# Patient Record
Sex: Male | Born: 1955
Health system: Southern US, Community
[De-identification: ages and names within clinical notes are randomized; demographics above are authoritative.]

## PROBLEM LIST (undated history)

## (undated) DIAGNOSIS — H269 Unspecified cataract: Secondary | ICD-10-CM

## (undated) DIAGNOSIS — I10 Essential (primary) hypertension: Secondary | ICD-10-CM

## (undated) DIAGNOSIS — C180 Malignant neoplasm of cecum: Secondary | ICD-10-CM

## (undated) DIAGNOSIS — K219 Gastro-esophageal reflux disease without esophagitis: Secondary | ICD-10-CM

## (undated) DIAGNOSIS — C801 Malignant (primary) neoplasm, unspecified: Secondary | ICD-10-CM

## (undated) HISTORY — DX: Gastro-esophageal reflux disease without esophagitis: K21.9

## (undated) HISTORY — PX: VARICOSE VEIN SURGERY: SHX832

## (undated) HISTORY — PX: INGUINAL HERNIA REPAIR: SUR1180

## (undated) HISTORY — PX: MENISCUS REPAIR: SHX5179

## (undated) HISTORY — DX: Essential (primary) hypertension: I10

---
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
## 2013-05-09 DIAGNOSIS — H269 Unspecified cataract: Secondary | ICD-10-CM | POA: Diagnosis present

## 2013-05-09 HISTORY — DX: Unspecified cataract: H26.9

## 2015-06-22 DIAGNOSIS — H401334 Pigmentary glaucoma, bilateral, indeterminate stage: Secondary | ICD-10-CM | POA: Insufficient documentation

## 2015-06-22 DIAGNOSIS — H25012 Cortical age-related cataract, left eye: Secondary | ICD-10-CM | POA: Insufficient documentation

## 2015-06-22 DIAGNOSIS — N529 Male erectile dysfunction, unspecified: Secondary | ICD-10-CM | POA: Insufficient documentation

## 2015-06-22 DIAGNOSIS — Z961 Presence of intraocular lens: Secondary | ICD-10-CM | POA: Insufficient documentation

## 2015-06-22 DIAGNOSIS — H04123 Dry eye syndrome of bilateral lacrimal glands: Secondary | ICD-10-CM | POA: Insufficient documentation

## 2015-06-22 DIAGNOSIS — I10 Essential (primary) hypertension: Secondary | ICD-10-CM | POA: Diagnosis present

## 2015-06-22 DIAGNOSIS — R7301 Impaired fasting glucose: Secondary | ICD-10-CM | POA: Insufficient documentation

## 2016-12-21 ENCOUNTER — Telehealth (HOSPITAL_COMMUNITY): Payer: Self-pay | Admitting: *Deleted

## 2016-12-21 DIAGNOSIS — Z8249 Family history of ischemic heart disease and other diseases of the circulatory system: Secondary | ICD-10-CM

## 2016-12-21 NOTE — Telephone Encounter (Signed)
Per Dr Haroldine Laws pt needs ca score CT scan, order placed will schedule

## 2016-12-30 ENCOUNTER — Ambulatory Visit (INDEPENDENT_AMBULATORY_CARE_PROVIDER_SITE_OTHER)
Admission: RE | Admit: 2016-12-30 | Discharge: 2016-12-30 | Disposition: A | Discharge status: Home / Self Care | Payer: Self-pay | Source: Ambulatory Visit | Attending: Internal Medicine | Admitting: Internal Medicine

## 2016-12-30 DIAGNOSIS — Z8249 Family history of ischemic heart disease and other diseases of the circulatory system: Secondary | ICD-10-CM

## 2018-05-09 HISTORY — PX: COLONOSCOPY: SHX174

## 2018-05-09 HISTORY — PX: ENDOSCOPIC VEIN LASER TREATMENT: SHX1508

## 2018-05-09 HISTORY — PX: POLYPECTOMY: SHX149

## 2018-12-26 ENCOUNTER — Other Ambulatory Visit: Payer: Self-pay

## 2018-12-26 ENCOUNTER — Ambulatory Visit (HOSPITAL_COMMUNITY)
Admission: RE | Admit: 2018-12-26 | Discharge: 2018-12-26 | Disposition: A | Discharge status: Home / Self Care | Payer: Self-pay | Source: Ambulatory Visit | Attending: Internal Medicine | Admitting: Internal Medicine

## 2018-12-26 DIAGNOSIS — I2584 Coronary atherosclerosis due to calcified coronary lesion: Secondary | ICD-10-CM

## 2018-12-26 DIAGNOSIS — I10 Essential (primary) hypertension: Secondary | ICD-10-CM

## 2018-12-26 DIAGNOSIS — I2510 Atherosclerotic heart disease of native coronary artery without angina pectoris: Secondary | ICD-10-CM

## 2018-12-26 DIAGNOSIS — R0683 Snoring: Secondary | ICD-10-CM

## 2018-12-26 NOTE — Progress Notes (Signed)
     Heart Failure TeleHealth Note  Due to national recommendations of social distancing due to [place] 19, Audio/video telehealth visit is felt to be most appropriate for this patient at this time.  See MyChart message from today for patient consent regarding telehealth for [facility].  Date:  12/26/2018   ID:  Isaiah Gray, DOB 1955/12/22, MRN [id number]  Location: Home  Provider location: [facility] Type of Visit: Established patient  PCP:  Derrill Center., MD  Cardiologist:  No primary care provider on file. Primary HF: [place]  Chief Complaint: HTN   History of Present Illness:  Isaiah Gray is as a 63 y/o male with h/o HTN, elevated fasting glucose and erectile dysfunction who presents via audio/video conferencing for a telehealth visit today regarding his HTN.    Denies any known h/o CV disease. Has calcium scoring CT in 8/18 with score 57 (59%)   Remains active with no CP or SOB. Has been on losartan 50 mg daily for HTN and recent his SBP shot up to 180s for a few days for unknown reasons. He increased his losartan to 100mg  daily and now SBP 115-135 range. Denies edema, orthopnea or PND. Says he has been told he snores heavily and has some fatigue. He drinks a moderate amount of alcohol at times but hasn't had a problem with it.   Isaiah Gray denies symptoms worrisome for COVID 19.   PMHX: 1. HTN 2. Erectile dysfunction  Meds: Losartan 100 daily ECASA 81 mg daily Sildenafil prn  Allergies:   Patient has no allergy information on record.   Social History:  Married. Non-smoker. Owns an Water quality scientist. Moderate ETOH  Family History:  No FHx of premature CAD or SCD.  ROS:  Please see the history of present illness.   All other systems are personally reviewed and negative.   Exam:  (Video/Tele Health Call; Exam is subjective and or/visual.) General:  Speaks in full sentences. No resp difficulty. Lungs: Normal respiratory effort with  conversation.  Abdomen: Non-distended per patient report Extremities: Pt denies edema. Neuro: Alert & oriented x 3.   Recent Labs: No results found for requested labs within last 8760 hours.  Personally reviewed   Wt Readings from Last 3 Encounters:  No data found for Wt      ASSESSMENT AND PLAN:  1.  HTN - BP now well controlled on increased losartan. Will continue  2. Coronary calcium - score was 57 (59%) in 2018 - on ecasa 81 - continue RF modification - last LDL was 106 in 11/19. Would like to see LDL < 100. He is resistant to statins. Consider red yeast rice  3. Heavy snoring - likely has OSA - will get home sleep study.   COVID screen The patient does not have any symptoms that suggest any further testing/ screening at this time.  Social distancing reinforced today.  Recommended follow-up:  As above  Relevant cardiac medications were reviewed at length with the patient today.   The patient does not have concerns regarding their medications at this time.   The following changes were made today:  As above  Today, I have spent 15 minutes with the patient with telehealth technology discussing the above issues .    Signed, Glori Bickers, MD  12/26/2018 2:40 PM  [facility] [place] [address] [facility] and [place] [address] [phone] (office) [phone] (fax)

## 2019-01-10 ENCOUNTER — Encounter: Payer: Self-pay | Admitting: Gastroenterology

## 2019-01-10 ENCOUNTER — Ambulatory Visit (INDEPENDENT_AMBULATORY_CARE_PROVIDER_SITE_OTHER): Payer: BC Managed Care – PPO | Admitting: Gastroenterology

## 2019-01-10 VITALS — BP 130/70 | HR 76 | Temp 98.3°F | Ht 70.0 in | Wt 181.1 lb

## 2019-01-10 DIAGNOSIS — Z86010 Personal history of colonic polyps: Secondary | ICD-10-CM

## 2019-01-10 DIAGNOSIS — K219 Gastro-esophageal reflux disease without esophagitis: Secondary | ICD-10-CM

## 2019-01-10 DIAGNOSIS — R0789 Other chest pain: Secondary | ICD-10-CM | POA: Diagnosis not present

## 2019-01-10 MED ORDER — SUCRALFATE 1 GM/10ML PO SUSP: 1.0000 g | Freq: Four times a day (QID) | ORAL | 1 refills | Status: DC | PRN

## 2019-01-10 MED ORDER — SUPREP BOWEL PREP KIT 17.5-3.13-1.6 GM/177ML PO SOLN: ORAL | 0 refills | Status: DC

## 2019-01-10 MED ORDER — OMEPRAZOLE MAGNESIUM 20 MG PO TBEC: 20.0000 mg | DELAYED_RELEASE_TABLET | Freq: Two times a day (BID) | ORAL | 3 refills | Status: DC

## 2019-01-10 NOTE — Patient Instructions (Addendum)
Normal BMI (Body Mass Index- based on height and weight) is between 19 and 25. Your BMI today is Body mass index is 25.99 kg/m. Marland Kitchen Please consider follow up  regarding your BMI with your Primary Care Provider.  To help prevent the possible spread of infection to our patients, communities, and staff; we will be implementing the following measures:  As of now we are not allowing any visitors/family members to accompany you to any upcoming appointments with [facility]. If you have any concerns about this please contact our office to discuss prior to the appointment.   You have been scheduled for an endoscopy. Please follow written instructions given to you at your visit today. If you use inhalers (even only as needed), please bring them with you on the day of your procedure. Your physician has requested that you go to www.startemmi.com and enter the access code given to you at your visit today. This web site gives a general overview about your procedure. However, you should still follow specific instructions given to you by our office regarding your preparation for the procedure.  We have sent the following medications to your pharmacy for you to pick up at your convenience: Omeprazole 20mg : Take twice a day Carafate suspension: Take 57ml every 6 hours as needed Suprep   Thank you for entrusting me with your care and for choosing [employer], Dr. Hutchins Cellar

## 2019-01-10 NOTE — Progress Notes (Signed)
HPI :  63 y/o male with a history of GERD and HTN, referred by Dr. Gwenyth Allegra for GERD / atypical chest pain.   He reports at baseline he has had some intermittent reflux that bothered him over years.  He has been using either over-the-counter Zantac or Prilosec OTC for years periodically, does not use daily.  About 2 weeks ago he developed new symptoms in which he developed pain in his mid chest radiating to his right shoulder blade after eating.  Sometimes he feels it more so with liquids than with solids.  Usually within a few minutes after swallowing he can feel an odd sensation in his mid to right back, and also feels it in his mid to lower chest at times - he interestingly feels this more prominently in his back than his chest  He denies any dysphagia or odynophagia, the active swallowing is fine.  If he is not eating anything or drinking anything he will not have any discomfort.  He denies any nausea or vomiting.  He denies any pain in his epigastric area or right upper quadrant.  He was initially given Protonix to take however states he felt perhaps worse on this regimen, and went back to his Prilosec OTC and has been using it daily since this past weekend.  He reports his symptoms are 70% improved since doing this, feels a to a milder extent at this time.  He has never had an upper endoscopy.  He has had an EKG and seen his primary care for this issue and is not thought to be cardiac.  He has a history of constipation that bothers him occasionally.  He denies any blood in his stools.  No lower abdominal pains.  He denies any known family history of colon cancer.  His grandmother had some sort of cancer he is not sure the primary.  He thinks he is due for a colonoscopy.  He states remotely he had 2 colonoscopies done by Dr. Ferdinand Lango of [facility].  He was told he had a poor prep on one exam and then a polyp noted on the follow-up exam however not removed per his report.  He then had a colonoscopy at  [facility] for another opinion, states no polyp was found or seen.  He thinks that was done about 8 years ago.  He is anxious about the discrepancies noted in his last colonoscopies and wants to have another exam for peace of mind.    Past Medical History:  Diagnosis Date  . GERD (gastroesophageal reflux disease)   . HTN (hypertension)      Past Surgical History:  Procedure Laterality Date  . ENDOSCOPIC VEIN LASER TREATMENT Bilateral   . INGUINAL HERNIA REPAIR Right    age 62  . MENISCUS REPAIR Bilateral   . VARICOSE VEIN SURGERY Right    Family History  Problem Relation Age of Onset  . Hypertension Mother   . Hypertension Father   . Colon cancer Maternal Grandmother 86  . Cancer Paternal Grandmother        ? intestinal   Social History   Tobacco Use  . Smoking status: Never Smoker  . Smokeless tobacco: Never Used  Substance Use Topics  . Alcohol use: Yes    Comment: weekends  . Drug use: Not on file   Current Outpatient Medications  Medication Sig Dispense Refill  . aspirin EC 81 MG tablet Take 1 tablet by mouth daily.    Marland Kitchen losartan (COZAAR)  50 MG tablet Take 50 mg by mouth daily.    Marland Kitchen omeprazole (PRILOSEC OTC) 20 MG tablet Take 1 tablet (20 mg total) by mouth 2 (two) times daily. 60 tablet 3  . sucralfate (CARAFATE) 1 GM/10ML suspension Take 10 mLs (1 g total) by mouth every 6 (six) hours as needed. 420 mL 1  . SUPREP BOWEL PREP KIT 17.5-3.13-1.6 GM/177ML SOLN Suprep-Use as directed 354 mL 0   No current facility-administered medications for this visit.    No Known Allergies   Review of Systems: All systems reviewed and negative except where noted in HPI.   Labs normal in Care everywhere  Physical Exam: BP 130/70 (BP Location: Left Arm, Patient Position: Sitting, Cuff Size: Normal)   Pulse 76   Temp 98.3 F (36.8 C)   Ht _0  (1.778 m) Comment: height measured without shoes  Wt 181 lb 2 oz (82.2 kg)   BMI 25.99 kg/m  Constitutional:  Pleasant,well-developed, male in no acute distress. HEENT: Normocephalic and atraumatic. Conjunctivae are normal. No scleral icterus. Neck supple.  Cardiovascular: Normal rate, regular rhythm.  Pulmonary/chest: Effort normal and breath sounds normal. No wheezing, rales or rhonchi. Abdominal: Soft, nondistended, nontender. There are no masses palpable. No hepatomegaly. Extremities: no edema Lymphadenopathy: No cervical adenopathy noted. Neurological: Alert and oriented to person place and time. Skin: Skin is warm and dry. No rashes noted. Psychiatric: Normal mood and affect. Behavior is normal.   ASSESSMENT AND PLAN: 63 year old male here for new patient assessment of the following:  GERD / Atypical chest pain - has had some chronic intermittent reflux symptoms over years, now with postprandial atypical chest pain and more so mid to right upper back discomfort.  Since he has been using Prilosec once daily his symptoms have significantly improved.  He denies any abdominal pain with this.  Symptoms could certainly be due to reflux / esophagitis although atypical, while esophageal spasm is also on the differential.  He denies any abdominal pain whatsoever, biliary colic seems less likely.  Discussed options with him.  He has had longstanding reflux, in the setting of recommending an upper endoscopy to further evaluate, assess for erosive esophagitis/Barrett's esophagus, ensure no mass lesion.  I discussed risk and benefits of endoscopy with him and he want to proceed.  In the interim I recommend he increase his Prilosec to 20 mg twice a day, and will provide some liquid Carafate to take as needed when symptoms occur to see if this will help abort an episode.  Further recommendations pending results and his course.  He agreed  History of colon polyps - discrepant findings on his last few colonoscopies, he has last time exam about 8 years ago and is quite anxious about being told he had a polyp remotely  but never being removed.  He is asking for a colonoscopy for peace of mind and ensure no polyps.  I think that is reasonable, I discussed risks and benefits of colonoscopy and anesthesia with him and he want to proceed.  We will do both of these exams at the same time.  [place] Cellar, MD [facility]  CC: Derrill Center., MD

## 2019-01-18 ENCOUNTER — Encounter: Payer: Self-pay | Admitting: Gastroenterology

## 2019-01-23 NOTE — Progress Notes (Signed)
Height: 5'10"    Weight: 181 lb BMI: 25.99  Today's Date:  STOP BANG RISK ASSESSMENT S (snore) Have you been told that you snore?     YES   T (tired) Are you often tired, fatigued, or sleepy during the day?   YES  O (obstruction) Do you stop breathing, choke, or gasp during sleep? NO   P (pressure) Do you have or are you being treated for high blood pressure? YES   B (BMI) Is your body index greater than 35 kg/m? NO   A (age) Are you 62 years old or older? YES   N (neck) Do you have a neck circumference greater than 16 inches?      G (gender) Are you a male? YES   TOTAL STOP/BANG "YES" ANSWERS 5                                                                       For Office Use Only              Procedure Order Form    YES to 3+ Stop Bang questions OR two clinical symptoms - patient qualifies for WatchPAT (CPT 95800)      Clinical Notes: Will consult Sleep Specialist and refer for management of therapy due to patient increased risk of Sleep Apnea. Ordering a sleep study due to the following two clinical symptoms: Excessive daytime sleepiness G47.10 /  Loud snoring R06.83   Which test do you need, WP1 or WP300???  . Do you have access to a smart device containing the app stores?  If YES, then -->WP1----YES   If NO, then  --->WP300

## 2019-01-23 NOTE — Addendum Note (Signed)
Encounter addended by: Scarlette Calico, RN on: 01/23/2019 12:15 PM  Actions taken: Clinical Note Signed, Order list changed, Diagnosis association updated

## 2019-01-25 ENCOUNTER — Other Ambulatory Visit: Payer: Self-pay

## 2019-01-25 ENCOUNTER — Encounter: Payer: Self-pay | Admitting: Gastroenterology

## 2019-01-25 ENCOUNTER — Other Ambulatory Visit: Payer: Self-pay | Admitting: Gastroenterology

## 2019-01-25 ENCOUNTER — Ambulatory Visit (AMBULATORY_SURGERY_CENTER): Payer: BC Managed Care – PPO | Admitting: Gastroenterology

## 2019-01-25 VITALS — BP 136/67 | HR 64 | Temp 98.3°F | Resp 18 | Ht 70.0 in | Wt 181.0 lb

## 2019-01-25 DIAGNOSIS — K210 Gastro-esophageal reflux disease with esophagitis: Secondary | ICD-10-CM

## 2019-01-25 DIAGNOSIS — K222 Esophageal obstruction: Secondary | ICD-10-CM

## 2019-01-25 DIAGNOSIS — Z86010 Personal history of colonic polyps: Secondary | ICD-10-CM | POA: Diagnosis present

## 2019-01-25 DIAGNOSIS — D125 Benign neoplasm of sigmoid colon: Secondary | ICD-10-CM | POA: Diagnosis not present

## 2019-01-25 DIAGNOSIS — C180 Malignant neoplasm of cecum: Secondary | ICD-10-CM | POA: Diagnosis not present

## 2019-01-25 DIAGNOSIS — D120 Benign neoplasm of cecum: Secondary | ICD-10-CM

## 2019-01-25 DIAGNOSIS — D123 Benign neoplasm of transverse colon: Secondary | ICD-10-CM | POA: Diagnosis not present

## 2019-01-25 DIAGNOSIS — K219 Gastro-esophageal reflux disease without esophagitis: Secondary | ICD-10-CM

## 2019-01-25 MED ORDER — SODIUM CHLORIDE 0.9 % IV SOLN: 500.0000 mL | Freq: Once | INTRAVENOUS | Status: DC

## 2019-01-25 NOTE — Patient Instructions (Addendum)
YOU HAD AN ENDOSCOPIC PROCEDURE TODAY AT [facility]:   Refer to the procedure report that was given to you for any specific questions about what was found during the examination.  If the procedure report does not answer your questions, please call your gastroenterologist to clarify.  If you requested that your care partner not be given the details of your procedure findings, then the procedure report has been included in a sealed envelope for you to review at your convenience later.  YOU SHOULD EXPECT: Some feelings of bloating in the abdomen. Passage of more gas than usual.  Walking can help get rid of the air that was put into your GI tract during the procedure and reduce the bloating. If you had a lower endoscopy (such as a colonoscopy or flexible sigmoidoscopy) you may notice spotting of blood in your stool or on the toilet paper. If you underwent a bowel prep for your procedure, you may not have a normal bowel movement for a few days.  Please Note:  You might notice some irritation and congestion in your nose or some drainage.  This is from the oxygen used during your procedure.  There is no need for concern and it should clear up in a day or so.  SYMPTOMS TO REPORT IMMEDIATELY:   Following lower endoscopy (colonoscopy or flexible sigmoidoscopy):  Excessive amounts of blood in the stool  Significant tenderness or worsening of abdominal pains  Swelling of the abdomen that is new, acute  Fever of 100F or higher   Following upper endoscopy (EGD)  Vomiting of blood or coffee ground material  New chest pain or pain under the shoulder blades  Painful or persistently difficult swallowing  New shortness of breath  Fever of 100F or higher  Black, tarry-looking stools  For urgent or emergent issues, a gastroenterologist can be reached at any hour by calling [phone].   DIET:  We do recommend a small meal at first, but then you may proceed to your regular diet.  Drink  plenty of fluids but you should avoid alcoholic beverages for 24 hours.  MEDICATIONS: Continue present medications. Increase Omeprazole to twice daily and take Carafate as previously recommended given findings of esophagitis.  Follow up: Schedule CT scan of the abdomen/pelvis with contrast. Dr. Doyne Keel office will coordinate this for you and call you to schedule this appointment. 2 bottles of Readi-cat (oral contrast) sent home with patient. Dr. Havery Moros will recommend further follow-up after he received the pathology report and CT scan results.  Please see handouts given to you by your recovery nurse.  ACTIVITY:  You should plan to take it easy for the rest of today and you should NOT DRIVE or use heavy machinery until tomorrow (because of the sedation medicines used during the test).    FOLLOW UP: Our staff will call the number listed on your records 48-72 hours following your procedure to check on you and address any questions or concerns that you may have regarding the information given to you following your procedure. If we do not reach you, we will leave a message.  We will attempt to reach you two times.  During this call, we will ask if you have developed any symptoms of COVID 19. If you develop any symptoms (ie: fever, flu-like symptoms, shortness of breath, cough etc.) before then, please call [phone].  If you test positive for Covid 19 in the 2 weeks post procedure, please call and report this information to [place].  If any biopsies were taken you will be contacted by phone or by letter within the next 1-3 weeks.  Please call us at [phone] if you have not heard about the biopsies in 3 weeks.   Thank you for allowing [place] to provide for your healthcare needs today.   SIGNATURES/CONFIDENTIALITY: You and/or your care partner have signed paperwork which will be entered into your electronic medical record.  These signatures attest to the fact that that the information above  on your After Visit Summary has been reviewed and is understood.  Full responsibility of the confidentiality of this discharge information lies with you and/or your care-partner.

## 2019-01-25 NOTE — Op Note (Signed)
[facility] Patient Name: Isaiah Gray Procedure Date: 01/25/2019 4:15 PM MRN: [id number] Endoscopist: Remo Lipps P. Havery Moros , MD Age: 63 Referring MD:  Date of Birth: November 19, 1955 Gender: Male Account #: [id number] Procedure:                Upper GI endoscopy Indications:              history of gastro-esophageal reflux disease,                            atypical chest pain, rule out Barrett's, was on                            prilosec 20mg  once daily, recommended increasing to                            twice dail Medicines:                Monitored Anesthesia Care Procedure:                Pre-Anesthesia Assessment:                           - Prior to the procedure, a History and Physical                            was performed, and patient medications and                            allergies were reviewed. The patient's tolerance of                            previous anesthesia was also reviewed. The risks                            and benefits of the procedure and the sedation                            options and risks were discussed with the patient.                            All questions were answered, and informed consent                            was obtained. Prior Anticoagulants: The patient has                            taken no previous anticoagulant or antiplatelet                            agents. ASA Grade Assessment: II - A patient with                            mild systemic disease. After reviewing the risks  and benefits, the patient was deemed in                            satisfactory condition to undergo the procedure.                           After obtaining informed consent, the endoscope was                            passed under direct vision. Throughout the                            procedure, the patient's blood pressure, pulse, and                            oxygen saturations were monitored continuously.  The                            Endoscope was introduced through the mouth, and                            advanced to the second part of duodenum. The upper                            GI endoscopy was accomplished without difficulty.                            The patient tolerated the procedure well. Scope In: Scope Out: Findings:                 Esophagogastric landmarks were identified: the                            Z-line was found at 40 cm, the gastroesophageal                            junction was found at 40 cm and the upper extent of                            the gastric folds was found at 40 cm from the                            incisors.                           LA Grade A esophagitis was found at the GEJ, with a                            widely patent Shatski ring. No Barrett's noted.                           The exam of the esophagus was otherwise normal.  The entire examined stomach was normal.                           The duodenal bulb and second portion of the                            duodenum were normal. Complications:            No immediate complications. Estimated blood loss:                            Minimal. Estimated Blood Loss:     Estimated blood loss was minimal. Impression:               - Esophagogastric landmarks identified.                           - LA Grade A reflux esophagitis.                           - Normal stomach.                           - Normal duodenal bulb and second portion of the                            duodenum. Recommendation:           - Patient has a contact number available for                            emergencies. The signs and symptoms of potential                            delayed complications were discussed with the                            patient. Return to normal activities tomorrow.                            Written discharge instructions were provided to the                             patient.                           - Resume previous diet.                           - Continue present medications. Increase omeprazole                            to twice daily and take carafate as previously                            recommended given findings of esophagitis                           -  Await pathology results. Remo Lipps P. Armbruster, MD 01/25/2019 5:22:02 PM This report has been signed electronically.

## 2019-01-25 NOTE — Progress Notes (Signed)
A and O x3. Report to RN. Tolerated MAC anesthesia well.Teeth unchanged after procedure.

## 2019-01-25 NOTE — Op Note (Signed)
[facility] Patient Name: Isaiah Gray Procedure Date: 01/25/2019 4:15 PM MRN: [id number] Endoscopist: Remo Lipps P. Havery Moros , MD Age: 63 Referring MD:  Date of Birth: April 08, 1956 Gender: Male Account #: [id number] Procedure:                Colonoscopy Indications:              High risk colon cancer surveillance: Personal                            history of colonic polyps (patient reports having                            polyps noted in the past, reports not available -                            unclear findings) Medicines:                Monitored Anesthesia Care Procedure:                Pre-Anesthesia Assessment:                           - Prior to the procedure, a History and Physical                            was performed, and patient medications and                            allergies were reviewed. The patient's tolerance of                            previous anesthesia was also reviewed. The risks                            and benefits of the procedure and the sedation                            options and risks were discussed with the patient.                            All questions were answered, and informed consent                            was obtained. Prior Anticoagulants: The patient has                            taken no previous anticoagulant or antiplatelet                            agents. ASA Grade Assessment: II - A patient with                            mild systemic disease. After reviewing the risks  and benefits, the patient was deemed in                            satisfactory condition to undergo the procedure.                           After obtaining informed consent, the colonoscope                            was passed under direct vision. Throughout the                            procedure, the patient's blood pressure, pulse, and                            oxygen saturations were monitored  continuously. The                            Colonoscope was introduced through the anus and                            advanced to the the terminal ileum, with                            identification of the appendiceal orifice and IC                            valve. The colonoscopy was performed without                            difficulty. The patient tolerated the procedure                            well. The quality of the bowel preparation was                            adequate. The terminal ileum, ileocecal valve,                            appendiceal orifice, and rectum were photographed. Scope In: 4:33:13 PM Scope Out: 5:03:28 PM Scope Withdrawal Time: 0 hours 23 minutes 8 seconds  Total Procedure Duration: 0 hours 30 minutes 15 seconds  Findings:                 The perianal and digital rectal examinations were                            normal.                           The terminal ileum appeared normal.                           A large polyp was found in the cecum overlying the  appendiceal orifice. The polyp was sessile with a                            suspected area of central depression that was quite                            friable with ulceration and adherent heme,                            concerning for possible underlying malignancy. The                            lesion was at least 2-3cm in size. Biopsies were                            taken with a cold forceps for histology.                           A 20 to 30 mm polyp was found in the ascending                            colon which wrapped around a fold. The polyp was                            sessile and located directly across from the IC                            valve (photos taken for reference) - given location                            near the valve tattoo not placed. EMR was not                            attempted for removal given cecal findings and                             potential need for surgery.                           Three sessile polyps were found in the transverse                            colon. The polyps were 3 to 8 mm in size. These                            polyps were removed with a cold snare. Resection                            and retrieval were complete.                           Three sessile polyps were found in the sigmoid  colon. The polyps were 3 to 6 mm in size. These                            polyps were removed with a cold snare. Resection                            and retrieval were complete.                           A few medium-mouthed diverticula were found in the                            sigmoid colon.                           There was a very angulated turn in the distal                            sigmoid colon with restriced mobility. The exam was                            otherwise without abnormality. Complications:            No immediate complications. Estimated blood loss:                            Minimal. Estimated Blood Loss:     Estimated blood loss was minimal. Impression:               - The examined portion of the ileum was normal.                           - One large polyp in the cecum overlying the                            appendiceal orifice, described as above. Biopsied.                           - One 20 to 30 mm polyp in the ascending colon. Not                            attempted to be removed today given findings in the                            cecum                           - Three 3 to 8 mm polyps in the transverse colon,                            removed with a cold snare. Resected and retrieved.                           - Three 3 to 6 mm polyps in the sigmoid  colon,                            removed with a cold snare. Resected and retrieved.                           - Diverticulosis in the sigmoid colon.                           -  The examination was otherwise normal.                           Overall, features of cecal polyp are concerning for                            possible malignant features. Will await biopsies,                            CT imaging will be needed, suspect this will                            require surgical resection for removal. Recommendation:           - Patient has a contact number available for                            emergencies. The signs and symptoms of potential                            delayed complications were discussed with the                            patient. Return to normal activities tomorrow.                            Written discharge instructions were provided to the                            patient.                           - Resume previous diet.                           - Continue present medications.                           - Await pathology results.                           - CT scan abdomen / pelvis with contrast - our                            office will coordinate this for you. Further  recommendations pending the results. Remo Lipps P. Armbruster, MD 01/25/2019 5:17:14 PM This report has been signed electronically.

## 2019-01-25 NOTE — Progress Notes (Signed)
Called to room to assist during endoscopic procedure.  Patient ID and intended procedure confirmed with present staff. Received instructions for my participation in the procedure from the performing physician.  

## 2019-01-29 ENCOUNTER — Other Ambulatory Visit: Payer: Self-pay

## 2019-01-29 ENCOUNTER — Telehealth: Payer: Self-pay | Admitting: *Deleted

## 2019-01-29 DIAGNOSIS — Z86010 Personal history of colonic polyps: Secondary | ICD-10-CM

## 2019-01-29 NOTE — Telephone Encounter (Signed)
Called patient with results - see path note for details

## 2019-01-29 NOTE — Telephone Encounter (Signed)
  Follow up Call-  Call back number 01/25/2019  Post procedure Call Back phone  # [phone]  Permission to leave phone message Yes  Some recent data might be hidden     Patient questions:  Do you have a fever, pain , or abdominal swelling? No. Pain Score  0 *  Have you tolerated food without any problems? Yes.    Have you been able to return to your normal activities? Yes.    Do you have any questions about your discharge instructions: Diet   No. Medications  No. Follow up visit  No.  Do you have questions or concerns about your Care? No.  Actions: * If pain score is 4 or above: No action needed, pain <4.  Called for routine follow-up, he was inquiring about results and was under impression he might hear from you today.  He is anxiously awaiting your call. Advised I forward this concern to you.  1. Have you developed a fever since your procedure? no  2.   Have you had an respiratory symptoms (SOB or cough) since your procedure? no  3.   Have you tested positive for COVID 19 since your procedure no  4.   Have you had any family members/close contacts diagnosed with the COVID 19 since your procedure?  no   If yes to any of these questions please route to Joylene John, RN and Alphonsa Gin, Therapist, sports.

## 2019-01-30 ENCOUNTER — Telehealth: Payer: Self-pay

## 2019-01-30 ENCOUNTER — Other Ambulatory Visit (INDEPENDENT_AMBULATORY_CARE_PROVIDER_SITE_OTHER): Payer: BC Managed Care – PPO

## 2019-01-30 DIAGNOSIS — Z86010 Personal history of colonic polyps: Secondary | ICD-10-CM

## 2019-01-30 LAB — COMPREHENSIVE METABOLIC PANEL
ALT: 13 U/L (ref 0–53)
AST: 16 U/L (ref 0–37)
Albumin: 4.1 g/dL (ref 3.5–5.2)
Alkaline Phosphatase: 64 U/L (ref 39–117)
BUN: 20 mg/dL (ref 6–23)
CO2: 29 mEq/L (ref 19–32)
Calcium: 9.6 mg/dL (ref 8.4–10.5)
Chloride: 102 mEq/L (ref 96–112)
Creatinine, Ser: 1.25 mg/dL (ref 0.40–1.50)
GFR: 58.45 mL/min — ABNORMAL LOW (ref >60.00)
Glucose, Bld: 93 mg/dL (ref 70–99)
Potassium: 4 mEq/L (ref 3.5–5.1)
Sodium: 139 mEq/L (ref 135–145)
Total Bilirubin: 0.6 mg/dL (ref 0.2–1.2)
Total Protein: 6.5 g/dL (ref 6.0–8.3)

## 2019-01-30 LAB — CBC WITH DIFFERENTIAL/PLATELET
Basophils Absolute: 0 10*3/uL (ref 0.0–0.1)
Basophils Relative: 0.5 % (ref 0.0–3.0)
Eosinophils Absolute: 0.3 10*3/uL (ref 0.0–0.7)
Eosinophils Relative: 4.4 % (ref 0.0–5.0)
HCT: 43.6 % (ref 39.0–52.0)
Hemoglobin: 15 g/dL (ref 13.0–17.0)
Lymphocytes Relative: 27.9 % (ref 12.0–46.0)
Lymphs Abs: 1.6 10*3/uL (ref 0.7–4.0)
MCHC: 34.4 g/dL (ref 30.0–36.0)
MCV: 91.3 fl (ref 78.0–100.0)
Monocytes Absolute: 0.6 10*3/uL (ref 0.1–1.0)
Monocytes Relative: 10.4 % (ref 3.0–12.0)
Neutro Abs: 3.2 10*3/uL (ref 1.4–7.7)
Neutrophils Relative %: 56.8 % (ref 43.0–77.0)
Platelets: 239 10*3/uL (ref 150.0–400.0)
RBC: 4.78 Mil/uL (ref 4.22–5.81)
RDW: 13.2 % (ref 11.5–15.5)
WBC: 5.7 10*3/uL (ref 4.0–10.5)

## 2019-01-30 LAB — CEA: CEA: 4.3 ng/mL — ABNORMAL HIGH

## 2019-01-30 NOTE — Telephone Encounter (Addendum)
Called patient at request of Dr. Havery Moros to establish contact and to answer general questions about what process moving forward will look like. He understands that if CT scans on on 9/30 show no distant metastasis then he will see surgeon first and will be scheduled for medical oncology appointment approximately two weeks after surgery. Encouraged him to reach out to me with questions or concerns.   Arna Snipe, MS Ed.S, RN  Gastrointestinal Oncology Nurse [facility] Phone: [phone]

## 2019-02-01 ENCOUNTER — Telehealth (HOSPITAL_COMMUNITY): Payer: Self-pay | Admitting: *Deleted

## 2019-02-01 NOTE — Telephone Encounter (Signed)
Received fax from [facility], they reached out to pt regarding home sleep study Dr Haroldine Laws had ordered, however pt told them he had other health issues that have taken priority and he didn't want a sleep test at this time.

## 2019-02-06 ENCOUNTER — Encounter (HOSPITAL_COMMUNITY): Payer: Self-pay

## 2019-02-06 ENCOUNTER — Ambulatory Visit (HOSPITAL_COMMUNITY)
Admission: RE | Admit: 2019-02-06 | Discharge: 2019-02-06 | Disposition: A | Discharge status: Home / Self Care | Payer: BC Managed Care – PPO | Source: Ambulatory Visit | Attending: Gastroenterology | Admitting: Gastroenterology

## 2019-02-06 ENCOUNTER — Other Ambulatory Visit: Payer: Self-pay

## 2019-02-06 DIAGNOSIS — Z86010 Personal history of colonic polyps: Secondary | ICD-10-CM | POA: Diagnosis not present

## 2019-02-06 MED ORDER — IOHEXOL 300 MG/ML  SOLN
100.0000 mL | Freq: Once | INTRAMUSCULAR | Status: AC | PRN
  Administered 2019-02-06: 100 mL via INTRAVENOUS

## 2019-02-06 MED ORDER — SODIUM CHLORIDE (PF) 0.9 % IJ SOLN
INTRAMUSCULAR | Status: AC
  Filled 2019-02-06: qty 50

## 2019-02-26 ENCOUNTER — Ambulatory Visit: Payer: Self-pay | Admitting: Surgery

## 2019-02-26 NOTE — H&P (Signed)
CC: Newly diagnosed colon cancer  HPI: Isaiah Gray is a very pleasant 63yoM with hx of HTN, GERD underwent evaluation for GERD-like symptoms with Dr. Havery Moros. He reports a personal history of having had 3 colonoscopies somewhere back around 2012 with his first being incomplete/poor prep and his subsequent showing a carpeting type polyp in an unknown location of his colon. He then had another colonoscopy of the different endoscopist and he states he was told that there is no polyps present. He reports remaining suspicious of these comments. When he developed his GERD-like symptoms and saw GI, he also mentioned the discrepancies with his colonoscopy history and wound hadn't had a colonoscopy done. This was completed by Dr. Havery Moros on 01/25/2019.  Findings: 1. Normal terminal ileum 2. Large polyp in the cecum overlying appendiceal orifice with central depression and friability. 2-3 cm cyst in size. Biopsied. Returned adenocarcinoma. 3. A 23 cm polyp in the ascending colon wrapped around a fold that was sessile and directly across from ileocecal valve, given location, no tattoo was placed. EMR was not attempted due to potential need for surgery. 4. 3 sessile polyps in transverse colon 3-8 mm in size that were removed. Returned tubular adenoma 5. 3 sessile polyps in the sigmoid colon 3-6 mm in size or removed. Returned tubular adenoma 6. Medium mouth diverticula and sigmoid 7. Angulated distal sigmoid with restricted mobility.  Subsequently underwent CT staging chest/abdomen/pelvis 02/06/2019 which demonstrated eccentric wall thickening/mass on the medial wall of the cecum corresponding to the biopsy-proven colon cancer. 2 small ileocolic nodes measuring up to 5 mm, indeterminate. 4 mm subpleural nodule favored benign. No evidence of metastatic disease in the chest/abdomen/pelvis.  Blood work was notable for an albumin of 4.1 CEA: 4.3.  He was subsequently referred to [place]. He  denies any symptoms related to this. He denies any abdominal pain, nausea/vomiting, blood in his stool. He denies any history of chest pain, shortness of breath, stroke. He can comfortably ascend 2 flights of stairs without any difficulty  PMH: Hypertension (well controlled on oral antihypertensive); GERD (improving on PPI therapy)  PSH: He denies any prior abdominal or pelvic surgical history  FHx: Maternal grandmother had colon cancer in her 68s; reports his mother may have had a history of colon polyps but he is unsure  Social: Denies use of tobacco; social alcohol use; reports occasional marijuana use. He is here today with his wife. 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  The patient is a 63 year old male.   Past Surgical History Nance Pew, CMA; 02/19/2019 9:25 AM) Cataract Surgery  Right. Colon Polyp Removal - Colonoscopy  Knee Surgery  Bilateral.  Diagnostic Studies History Nance Pew, CMA; 02/19/2019 9:25 AM) Colonoscopy  within last year  Allergies ([place], CMA; 02/19/2019 9:26 AM) No Known Allergies  [02/19/2019]: No Known Drug Allergies  [02/19/2019]: Allergies Reconciled   Medication History Nance Pew, CMA; 02/19/2019 9:27 AM) Losartan Potassium (50MG  Tablet, Oral) Active. Pantoprazole Sodium (40MG  Tablet DR, Oral) Active. Aspirin (325MG  Tablet, 1 (one) Oral) Active. Red Yeast Rice (500MG /0.5GM Powder, Oral) Active. Medications Reconciled  Social History Nance Pew, CMA; 02/19/2019 9:25 AM) Alcohol use  Occasional alcohol use. Caffeine use  Coffee, Tea. Illicit drug use  Uses socially only. Tobacco use  Never smoker.  Family History Nance Pew, [place]; 02/19/2019 9:25 AM) Alcohol  Abuse  Brother, Father. Breast Cancer  Mother. Colon Polyps   Mother. Depression  Father. Hypertension  Father, Mother.  Other Problems Nance Pew, CMA; 02/19/2019 9:25 AM) Colon Cancer  Gastroesophageal Reflux Disease  High blood pressure  Ventral Hernia Repair     Review of Systems (Sabrina Canty CMA; 02/19/2019 9:25 AM) General Not Present- Appetite Loss, Chills, Fatigue, Fever, Night Sweats, Weight Gain and Weight Loss. Skin Not Present- Change in Wart/Mole, Dryness, Hives, Jaundice, New Lesions, Non-Healing Wounds, Rash and Ulcer. HEENT Not Present- Earache, Hearing Loss, Hoarseness, Nose Bleed, Oral Ulcers, Ringing in the Ears, Seasonal Allergies, Sinus Pain, Sore Throat, Visual Disturbances, Wears glasses/contact lenses and Yellow Eyes. Respiratory Present- Snoring. Not Present- Bloody sputum, Chronic Cough, Difficulty Breathing and Wheezing. Breast Not Present- Breast Mass, Breast Pain, Nipple Discharge and Skin Changes. Cardiovascular Not Present- Chest Pain, Difficulty Breathing Lying Down, Leg Cramps, Palpitations, Rapid Heart Rate, Shortness of Breath and Swelling of Extremities. Gastrointestinal Not Present- Abdominal Pain, Bloating, Bloody Stool, Change in Bowel Habits, Chronic diarrhea, Constipation, Difficulty Swallowing, Excessive gas, Gets full quickly at meals, Hemorrhoids, Indigestion, Nausea, Rectal Pain and Vomiting. Male Genitourinary Not Present- Blood in Urine, Change in Urinary Stream, Frequency, Impotence, Nocturia, Painful Urination, Urgency and Urine Leakage. Musculoskeletal Not Present- Back Pain, Joint Pain, Joint Stiffness, Muscle Pain, Muscle Weakness and Swelling of Extremities. Neurological Not Present- Decreased Memory, Fainting, Headaches, Numbness, Seizures, Tingling, Tremor, Trouble walking and Weakness. Psychiatric Not Present- Anxiety, Bipolar, Change in Sleep Pattern, Depression, Fearful and Frequent crying. Endocrine Not Present- Cold Intolerance, Excessive Hunger, Hair Changes, Heat Intolerance, Hot  flashes and New Diabetes. Hematology Not Present- Blood Thinners, Easy Bruising, Excessive bleeding, Gland problems, HIV and Persistent Infections.  Vitals (Sabrina Canty CMA; 02/19/2019 9:27 AM) 02/19/2019 9:27 AM Weight: 187 lb Height: 70in Body Surface Area: 2.03 m Body Mass Index: 26.83 kg/m  Temp.: 97.73F (Temporal)  Pulse: 78 (Regular)  BP: 144/68(Sitting, Left Arm, Standard)       Physical Exam [place] Gave M. Ruble Pumphrey MD; 02/19/2019 10:01 AM) The physical exam findings are as follows: Note: Constitutional: No acute distress; conversant; no deformities Eyes: Moist conjunctiva; no lid lag; anicteric sclerae; pupils equal round and reactive to light Neck: Trachea midline; no palpable thyromegaly Lungs: Normal respiratory effort; no tactile fremitus CV: RRR; no palpable thrill; no pitting edema in either lower extremity GI: Abdomen soft, nontender, nondistended; no palpable hepatosplenomegaly MSK: Normal gait; no clubbing/cyanosis Psychiatric: Appropriate affect; alert and oriented 3 Lymphatic: No palpable cervical or axillary lymphadenopathy    Assessment & Plan Harrell Gave M. Alaysiah Browder MD; 02/19/2019 10:05 AM)  COLON CANCER (C18.9) Story: Mr. Delmastro is a very pleasant 66yoM with hx of HTN, GERD whom presents for newly diagnosed cecal colon cancer and additionally 2-3 cm polyp adjacent to ileocecal valve CT C/A/P demonstrate no evidence of metastatic disease; cecal wall thickening noted consistent with known colon cancer in that location CEA 4.3 Alb 4.1  -The anatomy and physiology of the GI tract was discussed at length with the patient and his wife today using illustrations. The pathophysiology of colon polyps and colon cancer was discussed at length with associated pictures as well -We discussed laparoscopic and potential open techniques-right hemicolectomy. We discussed that without surgery this will progress and could ultimately take his life. We discussed  there are no nonsurgical options that durably provide cure. -The planned procedure, material risks (including, but not limited to, pain, bleeding, infection, scarring, need for blood transfusion, damage to surrounding structures- blood vessels/nerves/viscus/organs, damage to ureter,  leak from anastomosis, need for additional procedures, need for stoma which may be permanent, hernia, recurrence of colon cancer, pneumonia, heart attack, stroke, death) benefits and alternatives to surgery were discussed at length. The patient's and his wife's questions were answered to their satisfaction, they voiced understanding and elected to proceed with surgery. Additionally, we discussed typical postoperative expectations and the recovery process. -We also spent time discussing importance of postoperative surveillance and potential scenarios where adjuvant chemotherapy may be offered or recommended. -Our laparoscopic colorectal surgery handout was provided as additional reading material as well -You should hear from our office within 5 business days regarding scheduling your procedure - if you do not hear from [place] in this time frame, please contact our office and specifically ask to speak with my nurse, Isaiah Gray.  Signed electronically by Ileana Roup, MD (02/19/2019 10:05 AM)

## 2019-02-26 NOTE — H&P (View-Only) (Signed)
CC: Newly diagnosed colon cancer  HPI: Mr. Pretty is a very pleasant 3yoM with hx of HTN, GERD underwent evaluation for GERD-like symptoms with Dr. Havery Moros. He reports a personal history of having had 3 colonoscopies somewhere back around 2012 with his first being incomplete/poor prep and his subsequent showing a carpeting type polyp in an unknown location of his colon. He then had another colonoscopy of the different endoscopist and he states he was told that there is no polyps present. He reports remaining suspicious of these comments. When he developed his GERD-like symptoms and saw GI, he also mentioned the discrepancies with his colonoscopy history and wound hadn't had a colonoscopy done. This was completed by Dr. Havery Moros on 01/25/2019.  Findings: 1. Normal terminal ileum 2. Large polyp in the cecum overlying appendiceal orifice with central depression and friability. 2-3 cm cyst in size. Biopsied. Returned adenocarcinoma. 3. A 23 cm polyp in the ascending colon wrapped around a fold that was sessile and directly across from ileocecal valve, given location, no tattoo was placed. EMR was not attempted due to potential need for surgery. 4. 3 sessile polyps in transverse colon 3-8 mm in size that were removed. Returned tubular adenoma 5. 3 sessile polyps in the sigmoid colon 3-6 mm in size or removed. Returned tubular adenoma 6. Medium mouth diverticula and sigmoid 7. Angulated distal sigmoid with restricted mobility.  Subsequently underwent CT staging chest/abdomen/pelvis 02/06/2019 which demonstrated eccentric wall thickening/mass on the medial wall of the cecum corresponding to the biopsy-proven colon cancer. 2 small ileocolic nodes measuring up to 5 mm, indeterminate. 4 mm subpleural nodule favored benign. No evidence of metastatic disease in the chest/abdomen/pelvis.  Blood work was notable for an albumin of 4.1 CEA: 4.3.  He was subsequently referred to [place]. He  denies any symptoms related to this. He denies any abdominal pain, nausea/vomiting, blood in his stool. He denies any history of chest pain, shortness of breath, stroke. He can comfortably ascend 2 flights of stairs without any difficulty  PMH: Hypertension (well controlled on oral antihypertensive); GERD (improving on PPI therapy)  PSH: He denies any prior abdominal or pelvic surgical history  FHx: Maternal grandmother had colon cancer in her 34s; reports his mother may have had a history of colon polyps but he is unsure  Social: Denies use of tobacco; social alcohol use; reports occasional marijuana use. He is here today with his wife. 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  The patient is a 63 year old male.   Past Surgical History Nance Pew, CMA; 02/19/2019 9:25 AM) Cataract Surgery  Right. Colon Polyp Removal - Colonoscopy  Knee Surgery  Bilateral.  Diagnostic Studies History Nance Pew, CMA; 02/19/2019 9:25 AM) Colonoscopy  within last year  Allergies ([place], CMA; 02/19/2019 9:26 AM) No Known Allergies  [02/19/2019]: No Known Drug Allergies  [02/19/2019]: Allergies Reconciled   Medication History Nance Pew, CMA; 02/19/2019 9:27 AM) Losartan Potassium (50MG  Tablet, Oral) Active. Pantoprazole Sodium (40MG  Tablet DR, Oral) Active. Aspirin (325MG  Tablet, 1 (one) Oral) Active. Red Yeast Rice (500MG /0.5GM Powder, Oral) Active. Medications Reconciled  Social History Nance Pew, CMA; 02/19/2019 9:25 AM) Alcohol use  Occasional alcohol use. Caffeine use  Coffee, Tea. Illicit drug use  Uses socially only. Tobacco use  Never smoker.  Family History Nance Pew, [place]; 02/19/2019 9:25 AM) Alcohol  Abuse  Brother, Father. Breast Cancer  Mother. Colon Polyps   Mother. Depression  Father. Hypertension  Father, Mother.  Other Problems Nance Pew, CMA; 02/19/2019 9:25 AM) Colon Cancer  Gastroesophageal Reflux Disease  High blood pressure  Ventral Hernia Repair     Review of Systems (Sabrina Canty CMA; 02/19/2019 9:25 AM) General Not Present- Appetite Loss, Chills, Fatigue, Fever, Night Sweats, Weight Gain and Weight Loss. Skin Not Present- Change in Wart/Mole, Dryness, Hives, Jaundice, New Lesions, Non-Healing Wounds, Rash and Ulcer. HEENT Not Present- Earache, Hearing Loss, Hoarseness, Nose Bleed, Oral Ulcers, Ringing in the Ears, Seasonal Allergies, Sinus Pain, Sore Throat, Visual Disturbances, Wears glasses/contact lenses and Yellow Eyes. Respiratory Present- Snoring. Not Present- Bloody sputum, Chronic Cough, Difficulty Breathing and Wheezing. Breast Not Present- Breast Mass, Breast Pain, Nipple Discharge and Skin Changes. Cardiovascular Not Present- Chest Pain, Difficulty Breathing Lying Down, Leg Cramps, Palpitations, Rapid Heart Rate, Shortness of Breath and Swelling of Extremities. Gastrointestinal Not Present- Abdominal Pain, Bloating, Bloody Stool, Change in Bowel Habits, Chronic diarrhea, Constipation, Difficulty Swallowing, Excessive gas, Gets full quickly at meals, Hemorrhoids, Indigestion, Nausea, Rectal Pain and Vomiting. Male Genitourinary Not Present- Blood in Urine, Change in Urinary Stream, Frequency, Impotence, Nocturia, Painful Urination, Urgency and Urine Leakage. Musculoskeletal Not Present- Back Pain, Joint Pain, Joint Stiffness, Muscle Pain, Muscle Weakness and Swelling of Extremities. Neurological Not Present- Decreased Memory, Fainting, Headaches, Numbness, Seizures, Tingling, Tremor, Trouble walking and Weakness. Psychiatric Not Present- Anxiety, Bipolar, Change in Sleep Pattern, Depression, Fearful and Frequent crying. Endocrine Not Present- Cold Intolerance, Excessive Hunger, Hair Changes, Heat Intolerance, Hot  flashes and New Diabetes. Hematology Not Present- Blood Thinners, Easy Bruising, Excessive bleeding, Gland problems, HIV and Persistent Infections.  Vitals (Sabrina Canty CMA; 02/19/2019 9:27 AM) 02/19/2019 9:27 AM Weight: 187 lb Height: 70in Body Surface Area: 2.03 m Body Mass Index: 26.83 kg/m  Temp.: 97.54F (Temporal)  Pulse: 78 (Regular)  BP: 144/68(Sitting, Left Arm, Standard)       Physical Exam [place] Gave M. Aliciana Ricciardi MD; 02/19/2019 10:01 AM) The physical exam findings are as follows: Note: Constitutional: No acute distress; conversant; no deformities Eyes: Moist conjunctiva; no lid lag; anicteric sclerae; pupils equal round and reactive to light Neck: Trachea midline; no palpable thyromegaly Lungs: Normal respiratory effort; no tactile fremitus CV: RRR; no palpable thrill; no pitting edema in either lower extremity GI: Abdomen soft, nontender, nondistended; no palpable hepatosplenomegaly MSK: Normal gait; no clubbing/cyanosis Psychiatric: Appropriate affect; alert and oriented 3 Lymphatic: No palpable cervical or axillary lymphadenopathy    Assessment & Plan [place] Gave M. Shannon Balthazar MD; 02/19/2019 10:05 AM)  COLON CANCER (C18.9) Story: Mr. Trokey is a very pleasant 56yoM with hx of HTN, GERD whom presents for newly diagnosed cecal colon cancer and additionally 2-3 cm polyp adjacent to ileocecal valve CT C/A/P demonstrate no evidence of metastatic disease; cecal wall thickening noted consistent with known colon cancer in that location CEA 4.3 Alb 4.1  -The anatomy and physiology of the GI tract was discussed at length with the patient and his wife today using illustrations. The pathophysiology of colon polyps and colon cancer was discussed at length with associated pictures as well -We discussed laparoscopic and potential open techniques-right hemicolectomy. We discussed that without surgery this will progress and could ultimately take his life. We discussed  there are no nonsurgical options that durably provide cure. -The planned procedure, material risks (including, but not limited to, pain, bleeding, infection, scarring, need for blood transfusion, damage to surrounding structures- blood vessels/nerves/viscus/organs, damage to ureter,  leak from anastomosis, need for additional procedures, need for stoma which may be permanent, hernia, recurrence of colon cancer, pneumonia, heart attack, stroke, death) benefits and alternatives to surgery were discussed at length. The patient's and his wife's questions were answered to their satisfaction, they voiced understanding and elected to proceed with surgery. Additionally, we discussed typical postoperative expectations and the recovery process. -We also spent time discussing importance of postoperative surveillance and potential scenarios where adjuvant chemotherapy may be offered or recommended. -Our laparoscopic colorectal surgery handout was provided as additional reading material as well -You should hear from our office within 5 business days regarding scheduling your procedure - if you do not hear from [place] in this time frame, please contact our office and specifically ask to speak with my nurse, Livia Snellen.  Signed electronically by Ileana Roup, MD (02/19/2019 10:05 AM)

## 2019-03-10 HISTORY — PX: APPENDECTOMY: SHX54

## 2019-03-11 NOTE — Patient Instructions (Addendum)
DUE TO COVID-19 ONLY ONE VISITOR IS ALLOWED TO COME WITH YOU AND STAY IN THE WAITING ROOM ONLY DURING PRE OP AND PROCEDURE DAY OF SURGERY. THE 1 VISITOR MAY VISIT WITH YOU AFTER SURGERY IN YOUR PRIVATE ROOM DURING VISITING HOURS ONLY!  YOU NEED TO HAVE A COVID 19 TEST ON___11-3-2020____ @___3 :10PM____, THIS TEST MUST BE DONE BEFORE SURGERY, COME  [facility] [place] , [address].  ([place]) ONCE YOUR COVID TEST IS COMPLETED, PLEASE BEGIN THE QUARANTINE INSTRUCTIONS AS OUTLINED IN YOUR HANDOUT.                Olen Pel    Your procedure is scheduled on:    Report to [facility] Main  Entrance   Report to admitting at 10:30AM     Call this number if you have problems the morning of surgery [phone]    PLEASE FOLLOW [place]!  [place] [facility] PRESURGERY ENSURE DRINKS THE NIGHT BEFORE SURGERY AT 10:00 PM. NO SOLIDS AFTER MIDNIGHT THE NIGHT PRIOR TO THE SURGERY.  YOU MAY DRINK CLEAR LIQUIDS UNTIL __9:30 am_____ THE NEXT MORNING (SEE DIET BELOW). AT __9:30 am______ THE DAY OF SURGERY, FINISH THE LAST PRE-SURGERY DRINK. NOTHING BY MOUTH AFTER THE LAST ENSURE DRINK!   BRUSH YOUR TEETH MORNING OF SURGERY AND RINSE YOUR MOUTH OUT, NO CHEWING GUM CANDY OR MINTS.       CLEAR LIQUID DIET   Foods Allowed                                                                     Foods Excluded  Coffee and tea, regular and decaf                             liquids that you cannot  Plain Jell-O any favor except red or purple                                           see through such as: Fruit ices (not with fruit pulp)                                     milk, soups, orange juice  Iced Popsicles                                    All solid food Carbonated beverages, regular and diet                                    Cranberry, grape and apple juices Sports drinks like Gatorade Lightly seasoned clear broth or consume(fat  free) Sugar, honey syrup  Sample Menu Breakfast  Lunch                                     Supper Cranberry juice                    Beef broth                            Chicken broth Jell-O                                     Grape juice                           Apple juice Coffee or tea                        Jell-O                                      Popsicle                                                Coffee or tea                        Coffee or tea  _____________________________________________________________________     Take these medicines the morning of surgery with A SIP OF WATER: Omeprazole                                 You may not have any metal on your body including hair pins and              piercings  Do not wear jewelry, make-up, lotions, powders or perfumes, deodorant                        Men may shave face and neck.   Do not bring valuables to the hospital. [place].  Contacts, dentures or bridgework may not be worn into surgery.  YOU MAY BRING A SMALL OVERNIGHT BAG.                   Please read over the following fact sheets you were given: _____________________________________________________________________             [facility] - Preparing for Surgery Before surgery, you can play an important role.  Because skin is not sterile, your skin needs to be as free of germs as possible.  You can reduce the number of germs on your skin by washing with CHG (chlorahexidine gluconate) soap before surgery.  CHG is an antiseptic cleaner which kills germs and bonds with the skin to continue killing germs even after washing. Please DO NOT use if you have an allergy to CHG or antibacterial soaps.  If your skin becomes reddened/irritated stop using  the CHG and inform your nurse when you arrive at Short Stay. Do not shave (including legs and underarms) for at least 48 hours prior  to the first CHG shower.  You may shave your face/neck. Please follow these instructions carefully:  1.  Shower with CHG Soap the night before surgery and the  morning of Surgery.  2.  If you choose to wash your hair, wash your hair first as usual with your  normal  shampoo.  3.  After you shampoo, rinse your hair and body thoroughly to remove the  shampoo.                           4.  Use CHG as you would any other liquid soap.  You can apply chg directly  to the skin and wash                       Gently with a scrungie or clean washcloth.  5.  Apply the CHG Soap to your body ONLY FROM THE NECK DOWN.   Do not use on face/ open                           Wound or open sores. Avoid contact with eyes, ears mouth and genitals (private parts).                       Wash face,  Genitals (private parts) with your normal soap.             6.  Wash thoroughly, paying special attention to the area where your surgery  will be performed.  7.  Thoroughly rinse your body with warm water from the neck down.  8.  DO NOT shower/wash with your normal soap after using and rinsing off  the CHG Soap.                9.  Pat yourself dry with a clean towel.            10.  Wear clean pajamas.            11.  Place clean sheets on your bed the night of your first shower and do not  sleep with pets. Day of Surgery : Do not apply any lotions/deodorants the morning of surgery.  Please wear clean clothes to the hospital/surgery center.  FAILURE TO FOLLOW THESE INSTRUCTIONS MAY RESULT IN THE CANCELLATION OF YOUR SURGERY PATIENT SIGNATURE_________________________________  NURSE SIGNATURE__________________________________  ________________________________________________________________________   Adam Phenix  An incentive spirometer is a tool that can help keep your lungs clear and active. This tool measures how well you are filling your lungs with each breath. Taking long deep breaths may help reverse or  decrease the chance of developing breathing (pulmonary) problems (especially infection) following:  A long period of time when you are unable to move or be active. BEFORE THE PROCEDURE   If the spirometer includes an indicator to show your best effort, your nurse or respiratory therapist will set it to a desired goal.  If possible, sit up straight or lean slightly forward. Try not to slouch.  Hold the incentive spirometer in an upright position. INSTRUCTIONS FOR USE  1. Sit on the edge of your bed if possible, or sit up as far as you can in bed or on a chair. 2. Hold the incentive spirometer  in an upright position. 3. Breathe out normally. 4. Place the mouthpiece in your mouth and seal your lips tightly around it. 5. Breathe in slowly and as deeply as possible, raising the piston or the ball toward the top of the column. 6. Hold your breath for 3-5 seconds or for as long as possible. Allow the piston or ball to fall to the bottom of the column. 7. Remove the mouthpiece from your mouth and breathe out normally. 8. Rest for a few seconds and repeat Steps 1 through 7 at least 10 times every 1-2 hours when you are awake. Take your time and take a few normal breaths between deep breaths. 9. The spirometer may include an indicator to show your best effort. Use the indicator as a goal to work toward during each repetition. 10. After each set of 10 deep breaths, practice coughing to be sure your lungs are clear. If you have an incision (the cut made at the time of surgery), support your incision when coughing by placing a pillow or rolled up towels firmly against it. Once you are able to get out of bed, walk around indoors and cough well. You may stop using the incentive spirometer when instructed by your caregiver.  RISKS AND COMPLICATIONS  Take your time so you do not get dizzy or light-headed.  If you are in pain, you may need to take or ask for pain medication before doing incentive spirometry.  It is harder to take a deep breath if you are having pain. AFTER USE  Rest and breathe slowly and easily.  It can be helpful to keep track of a log of your progress. Your caregiver can provide you with a simple table to help with this. If you are using the spirometer at home, follow these instructions: [place] IF:   You are having difficultly using the spirometer.  You have trouble using the spirometer as often as instructed.  Your pain medication is not giving enough relief while using the spirometer.  You develop fever of 100.5 F (38.1 C) or higher. SEEK IMMEDIATE MEDICAL CARE IF:   You cough up bloody sputum that had not been present before.  You develop fever of 102 F (38.9 C) or greater.  You develop worsening pain at or near the incision site. MAKE SURE YOU:   Understand these instructions.  Will watch your condition.  Will get help right away if you are not doing well or get worse. Document Released: 09/05/2006 Document Revised: 07/18/2011 Document Reviewed: 11/06/2006 [facility] Patient Information 2014 [place], [place].   ________________________________________________________________________

## 2019-03-12 ENCOUNTER — Other Ambulatory Visit (HOSPITAL_COMMUNITY)
Admission: RE | Admit: 2019-03-12 | Discharge: 2019-03-12 | Disposition: A | Discharge status: Home / Self Care | Payer: BC Managed Care – PPO | Source: Ambulatory Visit | Attending: Surgery | Admitting: Surgery

## 2019-03-12 ENCOUNTER — Encounter (HOSPITAL_COMMUNITY)
Admission: RE | Admit: 2019-03-12 | Discharge: 2019-03-12 | Disposition: A | Discharge status: Home / Self Care | Payer: BC Managed Care – PPO | Source: Ambulatory Visit | Attending: Surgery | Admitting: Surgery

## 2019-03-12 ENCOUNTER — Encounter (HOSPITAL_COMMUNITY): Payer: Self-pay

## 2019-03-12 ENCOUNTER — Other Ambulatory Visit: Payer: Self-pay

## 2019-03-12 DIAGNOSIS — C189 Malignant neoplasm of colon, unspecified: Secondary | ICD-10-CM | POA: Insufficient documentation

## 2019-03-12 DIAGNOSIS — Z01818 Encounter for other preprocedural examination: Secondary | ICD-10-CM | POA: Insufficient documentation

## 2019-03-12 DIAGNOSIS — Z20828 Contact with and (suspected) exposure to other viral communicable diseases: Secondary | ICD-10-CM | POA: Insufficient documentation

## 2019-03-12 LAB — CBC WITH DIFFERENTIAL/PLATELET
Abs Immature Granulocytes: 0.03 10*3/uL (ref 0.00–0.07)
Basophils Absolute: 0 10*3/uL (ref 0.0–0.1)
Basophils Relative: 1 %
Eosinophils Absolute: 0.1 10*3/uL (ref 0.0–0.5)
Eosinophils Relative: 2 %
HCT: 46.8 % (ref 39.0–52.0)
Hemoglobin: 15.4 g/dL (ref 13.0–17.0)
Immature Granulocytes: 0 %
Lymphocytes Relative: 26 %
Lymphs Abs: 1.9 10*3/uL (ref 0.7–4.0)
MCH: 30.9 pg (ref 26.0–34.0)
MCHC: 32.9 g/dL (ref 30.0–36.0)
MCV: 93.8 fL (ref 80.0–100.0)
Monocytes Absolute: 0.7 10*3/uL (ref 0.1–1.0)
Monocytes Relative: 9 %
Neutro Abs: 4.4 10*3/uL (ref 1.7–7.7)
Neutrophils Relative %: 62 %
Platelets: 235 10*3/uL (ref 150–400)
RBC: 4.99 MIL/uL (ref 4.22–5.81)
RDW: 12.4 % (ref 11.5–15.5)
WBC: 7.1 10*3/uL (ref 4.0–10.5)
nRBC: 0 % (ref 0.0–0.2)

## 2019-03-12 LAB — COMPREHENSIVE METABOLIC PANEL
ALT: 21 U/L (ref 0–44)
AST: 22 U/L (ref 15–41)
Albumin: 4 g/dL (ref 3.5–5.0)
Alkaline Phosphatase: 73 U/L (ref 38–126)
Anion gap: 7 (ref 5–15)
BUN: 18 mg/dL (ref 8–23)
CO2: 27 mmol/L (ref 22–32)
Calcium: 9.3 mg/dL (ref 8.9–10.3)
Chloride: 105 mmol/L (ref 98–111)
Creatinine, Ser: 1.06 mg/dL (ref 0.61–1.24)
GFR calc Af Amer: >60 mL/min (ref >60)
GFR calc non Af Amer: >60 mL/min (ref >60)
Glucose, Bld: 98 mg/dL (ref 70–99)
Potassium: 4.6 mmol/L (ref 3.5–5.1)
Sodium: 139 mmol/L (ref 135–145)
Total Bilirubin: 0.8 mg/dL (ref 0.3–1.2)
Total Protein: 6.8 g/dL (ref 6.5–8.1)

## 2019-03-12 LAB — PROTIME-INR
INR: 1 (ref 0.8–1.2)
Prothrombin Time: 13.4 seconds (ref 11.4–15.2)

## 2019-03-12 LAB — HEMOGLOBIN A1C
Hgb A1c MFr Bld: 5.1 % (ref 4.8–5.6)
Mean Plasma Glucose: 99.67 mg/dL

## 2019-03-12 LAB — APTT: aPTT: 29 seconds (ref 24–36)

## 2019-03-12 NOTE — Progress Notes (Addendum)
PCP - Derrill Center., MD Cardiologist - DR Lynett Fish 12-26-2018 EPIC -ONLY SAW FOR SLEEP APNEA AND TO DO CALCIUM SCORE ROUTINE FOR AGE RISK PER PATIENT   Chest x-ray -  EKG - on chart received from Urgent Care - [place]  Stress Test -  ECHO -  Cardiac Cath -   Sleep Study -  CPAP -   Fasting Blood Sugar -  Checks Blood Sugar _____ times a day  Blood Thinner Instructions: Aspirin Instructions: REPORTS LAST DOSE WAS 10-31 Last Dose:  Anesthesia review:   Patient denies shortness of breath, fever, cough and chest pain at PAT appointment   Patient verbalized understanding of instructions that were given to them at the PAT appointment. Patient was also instructed that they will need to review over the PAT instructions again at home before surgery.

## 2019-03-13 LAB — NOVEL CORONAVIRUS, NAA (HOSP ORDER, SEND-OUT TO REF LAB; TAT 18-24 HRS): SARS-CoV-2, NAA: NOT DETECTED

## 2019-03-13 LAB — ABO/RH: ABO/RH(D): A POS

## 2019-03-14 MED ORDER — BUPIVACAINE LIPOSOME 1.3 % IJ SUSP
20.0000 mL | INTRAMUSCULAR | Status: DC
  Filled 2019-03-14: qty 20

## 2019-03-15 ENCOUNTER — Other Ambulatory Visit: Payer: Self-pay

## 2019-03-15 ENCOUNTER — Encounter (HOSPITAL_COMMUNITY)
Admission: RE | Disposition: A | Discharge status: Home / Self Care | Payer: Self-pay | Source: Home / Self Care | Attending: Surgery

## 2019-03-15 ENCOUNTER — Inpatient Hospital Stay (HOSPITAL_COMMUNITY): Payer: BC Managed Care – PPO | Admitting: Certified Registered"

## 2019-03-15 ENCOUNTER — Inpatient Hospital Stay (HOSPITAL_COMMUNITY): Payer: BC Managed Care – PPO | Admitting: Physician Assistant

## 2019-03-15 ENCOUNTER — Inpatient Hospital Stay (HOSPITAL_COMMUNITY)
Admission: RE | Admit: 2019-03-15 | Discharge: 2019-03-17 | DRG: 330 | Disposition: A | Discharge status: Home / Self Care | Payer: BC Managed Care – PPO | Attending: Surgery | Admitting: Surgery

## 2019-03-15 ENCOUNTER — Encounter (HOSPITAL_COMMUNITY): Payer: Self-pay | Admitting: *Deleted

## 2019-03-15 DIAGNOSIS — K660 Peritoneal adhesions (postprocedural) (postinfection): Secondary | ICD-10-CM | POA: Diagnosis present

## 2019-03-15 DIAGNOSIS — K219 Gastro-esophageal reflux disease without esophagitis: Secondary | ICD-10-CM | POA: Diagnosis present

## 2019-03-15 DIAGNOSIS — I10 Essential (primary) hypertension: Secondary | ICD-10-CM | POA: Diagnosis present

## 2019-03-15 DIAGNOSIS — Z20828 Contact with and (suspected) exposure to other viral communicable diseases: Secondary | ICD-10-CM | POA: Diagnosis present

## 2019-03-15 DIAGNOSIS — Z800 Family history of malignant neoplasm of digestive organs: Secondary | ICD-10-CM | POA: Diagnosis not present

## 2019-03-15 DIAGNOSIS — C772 Secondary and unspecified malignant neoplasm of intra-abdominal lymph nodes: Secondary | ICD-10-CM | POA: Diagnosis present

## 2019-03-15 DIAGNOSIS — Z8249 Family history of ischemic heart disease and other diseases of the circulatory system: Secondary | ICD-10-CM | POA: Diagnosis not present

## 2019-03-15 DIAGNOSIS — Z79899 Other long term (current) drug therapy: Secondary | ICD-10-CM | POA: Diagnosis not present

## 2019-03-15 DIAGNOSIS — Z8371 Family history of colonic polyps: Secondary | ICD-10-CM | POA: Diagnosis not present

## 2019-03-15 DIAGNOSIS — H269 Unspecified cataract: Secondary | ICD-10-CM | POA: Diagnosis present

## 2019-03-15 DIAGNOSIS — C180 Malignant neoplasm of cecum: Principal | ICD-10-CM | POA: Diagnosis present

## 2019-03-15 DIAGNOSIS — Z86010 Personal history of colonic polyps: Secondary | ICD-10-CM

## 2019-03-15 DIAGNOSIS — Z23 Encounter for immunization: Secondary | ICD-10-CM | POA: Diagnosis not present

## 2019-03-15 DIAGNOSIS — Z8719 Personal history of other diseases of the digestive system: Secondary | ICD-10-CM

## 2019-03-15 DIAGNOSIS — Z9049 Acquired absence of other specified parts of digestive tract: Secondary | ICD-10-CM

## 2019-03-15 HISTORY — DX: Malignant neoplasm of cecum: C18.0

## 2019-03-15 HISTORY — PX: LAPAROSCOPIC PARTIAL COLECTOMY: SHX5907

## 2019-03-15 LAB — TYPE AND SCREEN
ABO/RH(D): A POS
Antibody Screen: NEGATIVE

## 2019-03-15 SURGERY — LAPAROSCOPIC PARTIAL COLECTOMY
Anesthesia: General | Site: Abdomen | Laterality: Right

## 2019-03-15 MED ORDER — ENSURE SURGERY PO LIQD
237.0000 mL | Freq: Two times a day (BID) | ORAL | Status: DC
  Administered 2019-03-16 – 2019-03-17 (×2): 237 mL via ORAL
  Filled 2019-03-15 (×4): qty 237

## 2019-03-15 MED ORDER — NEOMYCIN SULFATE 500 MG PO TABS: 1000.0000 mg | ORAL_TABLET | ORAL | Status: DC

## 2019-03-15 MED ORDER — BUPIVACAINE LIPOSOME 1.3 % IJ SUSP
INTRAMUSCULAR | Status: DC | PRN
  Administered 2019-03-15: 20 mL

## 2019-03-15 MED ORDER — PROPOFOL 10 MG/ML IV BOLUS
INTRAVENOUS | Status: DC | PRN
  Administered 2019-03-15: 10 mg via INTRAVENOUS
  Administered 2019-03-15: 30 mg via INTRAVENOUS
  Administered 2019-03-15 (×2): 10 mg via INTRAVENOUS
  Administered 2019-03-15: 200 mg via INTRAVENOUS
  Administered 2019-03-15: 10 mg via INTRAVENOUS

## 2019-03-15 MED ORDER — INFLUENZA VAC SPLIT QUAD 0.5 ML IM SUSY
0.5000 mL | PREFILLED_SYRINGE | INTRAMUSCULAR | Status: AC
  Administered 2019-03-16: 0.5 mL via INTRAMUSCULAR
  Filled 2019-03-15: qty 0.5

## 2019-03-15 MED ORDER — BISACODYL 5 MG PO TBEC: 20.0000 mg | DELAYED_RELEASE_TABLET | Freq: Once | ORAL | Status: DC

## 2019-03-15 MED ORDER — OXYCODONE HCL 5 MG PO TABS: 5.0000 mg | ORAL_TABLET | Freq: Once | ORAL | Status: DC | PRN

## 2019-03-15 MED ORDER — PHENYLEPHRINE HCL-NACL 10-0.9 MG/250ML-% IV SOLN
INTRAVENOUS | Status: DC | PRN
  Administered 2019-03-15: 25 ug/min via INTRAVENOUS

## 2019-03-15 MED ORDER — ACETAMINOPHEN 500 MG PO TABS
1000.0000 mg | ORAL_TABLET | ORAL | Status: AC
  Administered 2019-03-15: 1000 mg via ORAL
  Filled 2019-03-15: qty 2

## 2019-03-15 MED ORDER — FENTANYL CITRATE (PF) 250 MCG/5ML IJ SOLN
INTRAMUSCULAR | Status: AC
  Filled 2019-03-15: qty 5

## 2019-03-15 MED ORDER — ALUM & MAG HYDROXIDE-SIMETH 200-200-20 MG/5ML PO SUSP
30.0000 mL | Freq: Four times a day (QID) | ORAL | Status: DC | PRN
  Administered 2019-03-16: 30 mL via ORAL
  Filled 2019-03-15: qty 30

## 2019-03-15 MED ORDER — POLYETHYLENE GLYCOL 3350 17 GM/SCOOP PO POWD: 1.0000 | Freq: Once | ORAL | Status: DC

## 2019-03-15 MED ORDER — ALVIMOPAN 12 MG PO CAPS
12.0000 mg | ORAL_CAPSULE | ORAL | Status: AC
  Administered 2019-03-15: 12 mg via ORAL
  Filled 2019-03-15: qty 1

## 2019-03-15 MED ORDER — HEPARIN SODIUM (PORCINE) 5000 UNIT/ML IJ SOLN
5000.0000 [IU] | Freq: Once | INTRAMUSCULAR | Status: AC
  Administered 2019-03-15: 5000 [IU] via SUBCUTANEOUS
  Filled 2019-03-15: qty 1

## 2019-03-15 MED ORDER — ONDANSETRON HCL 4 MG/2ML IJ SOLN: 4.0000 mg | Freq: Four times a day (QID) | INTRAMUSCULAR | Status: DC | PRN

## 2019-03-15 MED ORDER — DEXAMETHASONE SODIUM PHOSPHATE 10 MG/ML IJ SOLN
INTRAMUSCULAR | Status: DC | PRN
  Administered 2019-03-15: 5 mg via INTRAVENOUS

## 2019-03-15 MED ORDER — PANTOPRAZOLE SODIUM 40 MG PO TBEC
40.0000 mg | DELAYED_RELEASE_TABLET | Freq: Two times a day (BID) | ORAL | Status: DC
  Administered 2019-03-15 – 2019-03-17 (×4): 40 mg via ORAL
  Filled 2019-03-15 (×4): qty 1

## 2019-03-15 MED ORDER — LIDOCAINE HCL (CARDIAC) PF 100 MG/5ML IV SOSY
PREFILLED_SYRINGE | INTRAVENOUS | Status: DC | PRN
  Administered 2019-03-15: 40 mg via INTRAVENOUS
  Administered 2019-03-15: 60 mg via INTRAVENOUS

## 2019-03-15 MED ORDER — FENTANYL CITRATE (PF) 100 MCG/2ML IJ SOLN
25.0000 ug | INTRAMUSCULAR | Status: DC | PRN
  Administered 2019-03-15 (×2): 50 ug via INTRAVENOUS

## 2019-03-15 MED ORDER — MIDAZOLAM HCL 2 MG/2ML IJ SOLN
INTRAMUSCULAR | Status: AC
  Filled 2019-03-15: qty 2

## 2019-03-15 MED ORDER — PHENYLEPHRINE HCL (PRESSORS) 10 MG/ML IV SOLN
INTRAVENOUS | Status: DC | PRN
  Administered 2019-03-15: 50 ug via INTRAVENOUS
  Administered 2019-03-15: 100 ug via INTRAVENOUS
  Administered 2019-03-15: 50 ug via INTRAVENOUS
  Administered 2019-03-15: 100 ug via INTRAVENOUS

## 2019-03-15 MED ORDER — PROPOFOL 10 MG/ML IV BOLUS
INTRAVENOUS | Status: AC
  Filled 2019-03-15: qty 40

## 2019-03-15 MED ORDER — HYDROMORPHONE HCL 1 MG/ML IJ SOLN: 0.5000 mg | INTRAMUSCULAR | Status: DC | PRN

## 2019-03-15 MED ORDER — LOSARTAN POTASSIUM 50 MG PO TABS
50.0000 mg | ORAL_TABLET | Freq: Every day | ORAL | Status: DC
  Administered 2019-03-16 – 2019-03-17 (×2): 50 mg via ORAL
  Filled 2019-03-15 (×2): qty 1

## 2019-03-15 MED ORDER — CHLORHEXIDINE GLUCONATE CLOTH 2 % EX PADS: 6.0000 | MEDICATED_PAD | Freq: Once | CUTANEOUS | Status: DC

## 2019-03-15 MED ORDER — MIDAZOLAM HCL 5 MG/5ML IJ SOLN
INTRAMUSCULAR | Status: DC | PRN
  Administered 2019-03-15: 2 mg via INTRAVENOUS

## 2019-03-15 MED ORDER — FENTANYL CITRATE (PF) 100 MCG/2ML IJ SOLN
INTRAMUSCULAR | Status: AC
  Filled 2019-03-15: qty 2

## 2019-03-15 MED ORDER — HYDROMORPHONE HCL 1 MG/ML IJ SOLN
INTRAMUSCULAR | Status: DC | PRN
  Administered 2019-03-15 (×3): .2 mg via INTRAVENOUS

## 2019-03-15 MED ORDER — ROCURONIUM BROMIDE 100 MG/10ML IV SOLN
INTRAVENOUS | Status: DC | PRN
  Administered 2019-03-15: 20 mg via INTRAVENOUS
  Administered 2019-03-15: 15 mg via INTRAVENOUS
  Administered 2019-03-15: 50 mg via INTRAVENOUS
  Administered 2019-03-15: 10 mg via INTRAVENOUS

## 2019-03-15 MED ORDER — TRAMADOL HCL 50 MG PO TABS: 50.0000 mg | ORAL_TABLET | Freq: Four times a day (QID) | ORAL | Status: DC | PRN

## 2019-03-15 MED ORDER — BUPIVACAINE HCL (PF) 0.25 % IJ SOLN
INTRAMUSCULAR | Status: DC | PRN
  Administered 2019-03-15: 30 mL

## 2019-03-15 MED ORDER — SODIUM CHLORIDE 0.9 % IV SOLN
2.0000 g | INTRAVENOUS | Status: AC
  Administered 2019-03-15: 2 g via INTRAVENOUS
  Filled 2019-03-15: qty 2

## 2019-03-15 MED ORDER — HEPARIN SODIUM (PORCINE) 5000 UNIT/ML IJ SOLN
5000.0000 [IU] | Freq: Three times a day (TID) | INTRAMUSCULAR | Status: DC
  Administered 2019-03-16 – 2019-03-17 (×4): 5000 [IU] via SUBCUTANEOUS
  Filled 2019-03-15 (×4): qty 1

## 2019-03-15 MED ORDER — EPHEDRINE SULFATE 50 MG/ML IJ SOLN
INTRAMUSCULAR | Status: DC | PRN
  Administered 2019-03-15: 5 mg via INTRAVENOUS

## 2019-03-15 MED ORDER — ALVIMOPAN 12 MG PO CAPS
12.0000 mg | ORAL_CAPSULE | Freq: Two times a day (BID) | ORAL | Status: DC
  Administered 2019-03-16: 12 mg via ORAL
  Filled 2019-03-15: qty 1

## 2019-03-15 MED ORDER — BUPIVACAINE HCL (PF) 0.25 % IJ SOLN
INTRAMUSCULAR | Status: AC
  Filled 2019-03-15: qty 30

## 2019-03-15 MED ORDER — LACTATED RINGERS IV SOLN
INTRAVENOUS | Status: DC
  Administered 2019-03-15 (×2): via INTRAVENOUS

## 2019-03-15 MED ORDER — LACTATED RINGERS IR SOLN
Status: DC | PRN
  Administered 2019-03-15: 1000 mL

## 2019-03-15 MED ORDER — TRAMADOL HCL 50 MG PO TABS: 50.0000 mg | ORAL_TABLET | Freq: Four times a day (QID) | ORAL | 0 refills | Status: AC | PRN

## 2019-03-15 MED ORDER — OXYCODONE HCL 5 MG/5ML PO SOLN: 5.0000 mg | Freq: Once | ORAL | Status: DC | PRN

## 2019-03-15 MED ORDER — ONDANSETRON HCL 4 MG/2ML IJ SOLN
INTRAMUSCULAR | Status: DC | PRN
  Administered 2019-03-15: 4 mg via INTRAVENOUS

## 2019-03-15 MED ORDER — METRONIDAZOLE 500 MG PO TABS: 1000.0000 mg | ORAL_TABLET | ORAL | Status: DC

## 2019-03-15 MED ORDER — HYDROMORPHONE HCL 2 MG/ML IJ SOLN
INTRAMUSCULAR | Status: AC
  Filled 2019-03-15: qty 1

## 2019-03-15 MED ORDER — ACETAMINOPHEN 500 MG PO TABS
1000.0000 mg | ORAL_TABLET | Freq: Four times a day (QID) | ORAL | Status: DC
  Administered 2019-03-15 – 2019-03-17 (×6): 1000 mg via ORAL
  Filled 2019-03-15 (×6): qty 2

## 2019-03-15 MED ORDER — LACTATED RINGERS IV SOLN
INTRAVENOUS | Status: DC
  Administered 2019-03-15 – 2019-03-16 (×2): via INTRAVENOUS

## 2019-03-15 MED ORDER — GLYCOPYRROLATE 0.2 MG/ML IJ SOLN
INTRAMUSCULAR | Status: DC | PRN
  Administered 2019-03-15: 0.2 mg via INTRAVENOUS

## 2019-03-15 MED ORDER — ONDANSETRON HCL 4 MG PO TABS: 4.0000 mg | ORAL_TABLET | Freq: Four times a day (QID) | ORAL | Status: DC | PRN

## 2019-03-15 MED ORDER — SUGAMMADEX SODIUM 200 MG/2ML IV SOLN
INTRAVENOUS | Status: DC | PRN
  Administered 2019-03-15: 200 mg via INTRAVENOUS

## 2019-03-15 MED ORDER — 0.9 % SODIUM CHLORIDE (POUR BTL) OPTIME
TOPICAL | Status: DC | PRN
  Administered 2019-03-15: 2000 mL

## 2019-03-15 MED ORDER — FENTANYL CITRATE (PF) 100 MCG/2ML IJ SOLN
INTRAMUSCULAR | Status: DC | PRN
  Administered 2019-03-15: 100 ug via INTRAVENOUS
  Administered 2019-03-15 (×3): 50 ug via INTRAVENOUS

## 2019-03-15 MED ORDER — IBUPROFEN 200 MG PO TABS
600.0000 mg | ORAL_TABLET | Freq: Four times a day (QID) | ORAL | Status: DC | PRN
  Filled 2019-03-15: qty 3

## 2019-03-15 SURGICAL SUPPLY — 76 items
ADH SKN CLS APL DERMABOND .7 (GAUZE/BANDAGES/DRESSINGS) ×1
APPLIER CLIP 5 13 M/L LIGAMAX5 (MISCELLANEOUS)
APPLIER CLIP ROT 10 11.4 M/L (STAPLE)
APR CLP MED LRG 11.4X10 (STAPLE)
APR CLP MED LRG 5 ANG JAW (MISCELLANEOUS)
BLADE EXTENDED COATED 6.5IN (ELECTRODE) IMPLANT
CABLE HIGH FREQUENCY MONO STRZ (ELECTRODE) ×3 IMPLANT
CATH MUSHROOM 28FR (CATHETERS) IMPLANT
CATH MUSHROOM 30FR (CATHETERS) IMPLANT
CELLS DAT CNTRL 66122 CELL SVR (MISCELLANEOUS) IMPLANT
CLIP APPLIE 5 13 M/L LIGAMAX5 (MISCELLANEOUS) IMPLANT
CLIP APPLIE ROT 10 11.4 M/L (STAPLE) IMPLANT
COVER WAND RF STERILE (DRAPES) IMPLANT
DECANTER SPIKE VIAL GLASS SM (MISCELLANEOUS) ×3 IMPLANT
DERMABOND ADVANCED (GAUZE/BANDAGES/DRESSINGS) ×2
DERMABOND ADVANCED .7 DNX12 (GAUZE/BANDAGES/DRESSINGS) IMPLANT
DISSECTOR BLUNT TIP ENDO 5MM (MISCELLANEOUS) IMPLANT
DRAIN CHANNEL 19F RND (DRAIN) IMPLANT
DRAPE SURG IRRIG POUCH 19X23 (DRAPES) ×3 IMPLANT
DRSG OPSITE POSTOP 4X10 (GAUZE/BANDAGES/DRESSINGS) IMPLANT
DRSG OPSITE POSTOP 4X6 (GAUZE/BANDAGES/DRESSINGS) ×2 IMPLANT
DRSG OPSITE POSTOP 4X8 (GAUZE/BANDAGES/DRESSINGS) IMPLANT
ELECT REM PT RETURN 15FT ADLT (MISCELLANEOUS) ×3 IMPLANT
EVACUATOR SILICONE 100CC (DRAIN) IMPLANT
GAUZE SPONGE 4X4 12PLY STRL (GAUZE/BANDAGES/DRESSINGS) IMPLANT
GLOVE BIO SURGEON STRL SZ7.5 (GLOVE) ×6 IMPLANT
GLOVE INDICATOR 8.0 STRL GRN (GLOVE) ×6 IMPLANT
GOWN STRL REUS W/TWL XL LVL3 (GOWN DISPOSABLE) ×12 IMPLANT
HOLDER FOLEY CATH W/STRAP (MISCELLANEOUS) ×3 IMPLANT
KIT TURNOVER KIT A (KITS) IMPLANT
LIGASURE IMPACT 36 18CM CVD LR (INSTRUMENTS) IMPLANT
NDL INSUFFLATION 14GA 120MM (NEEDLE) IMPLANT
NEEDLE INSUFFLATION 14GA 120MM (NEEDLE) IMPLANT
PACK COLON (CUSTOM PROCEDURE TRAY) ×3 IMPLANT
PAD POSITIONING PINK XL (MISCELLANEOUS) ×3 IMPLANT
PORT LAP GEL ALEXIS MED 5-9CM (MISCELLANEOUS) IMPLANT
RELOAD PROXIMATE 75MM BLUE (ENDOMECHANICALS) ×6 IMPLANT
RELOAD STAPLE 75 3.8 BLU REG (ENDOMECHANICALS) IMPLANT
RETRACTOR WND ALEXIS 18 MED (MISCELLANEOUS) IMPLANT
RTRCTR WOUND ALEXIS 18CM MED (MISCELLANEOUS)
SCISSORS LAP 5X35 DISP (ENDOMECHANICALS) ×3 IMPLANT
SEALER TISSUE G2 STRG ARTC 35C (ENDOMECHANICALS) ×3 IMPLANT
SET IRRIG TUBING LAPAROSCOPIC (IRRIGATION / IRRIGATOR) ×3 IMPLANT
SET TUBE SMOKE EVAC HIGH FLOW (TUBING) ×3 IMPLANT
SLEEVE ADV FIXATION 5X100MM (TROCAR) ×6 IMPLANT
SPONGE DRAIN TRACH 4X4 STRL 2S (GAUZE/BANDAGES/DRESSINGS) IMPLANT
STAPLER GUN LINEAR PROX 60 (STAPLE) ×2 IMPLANT
STAPLER PROXIMATE 75MM BLUE (STAPLE) ×2 IMPLANT
STAPLER VISISTAT 35W (STAPLE) IMPLANT
SUT ETHILON 3 0 PS 1 (SUTURE) IMPLANT
SUT PDS AB 1 CT1 27 (SUTURE) ×4 IMPLANT
SUT PDS AB 1 CTX 36 (SUTURE) IMPLANT
SUT PDS AB 1 TP1 54 (SUTURE) IMPLANT
SUT PDS AB 1 TP1 96 (SUTURE) IMPLANT
SUT PROLENE 2 0 KS (SUTURE) ×3 IMPLANT
SUT PROLENE 2 0 SH DA (SUTURE) ×3 IMPLANT
SUT SILK 2 0 (SUTURE) ×3
SUT SILK 2 0 SH CR/8 (SUTURE) ×3 IMPLANT
SUT SILK 2-0 18XBRD TIE 12 (SUTURE) ×1 IMPLANT
SUT SILK 3 0 (SUTURE) ×3
SUT SILK 3 0 SH CR/8 (SUTURE) ×3 IMPLANT
SUT SILK 3-0 18XBRD TIE 12 (SUTURE) ×1 IMPLANT
SUT VIC AB 2-0 SH 27 (SUTURE)
SUT VIC AB 2-0 SH 27X BRD (SUTURE) IMPLANT
SUT VIC AB 3-0 SH 18 (SUTURE) IMPLANT
SUT VIC AB 3-0 SH 27 (SUTURE) ×3
SUT VIC AB 3-0 SH 27X BRD (SUTURE) IMPLANT
SUT VICRYL 2 0 18  UND BR (SUTURE) ×2
SUT VICRYL 2 0 18 UND BR (SUTURE) ×1 IMPLANT
SYS LAPSCP GELPORT 120MM (MISCELLANEOUS)
SYSTEM LAPSCP GELPORT 120MM (MISCELLANEOUS) IMPLANT
TOWEL OR 17X26 10 PK STRL BLUE (TOWEL DISPOSABLE) IMPLANT
TOWEL OR NON WOVEN STRL DISP B (DISPOSABLE) ×3 IMPLANT
TRAY FOLEY MTR SLVR 16FR STAT (SET/KITS/TRAYS/PACK) ×2 IMPLANT
TROCAR ADV FIXATION 5X100MM (TROCAR) ×3 IMPLANT
TROCAR XCEL BLUNT TIP 100MML (ENDOMECHANICALS) IMPLANT

## 2019-03-15 NOTE — Op Note (Signed)
PATIENT: Isaiah Gray  63 y.o. male  Patient Care Team: Derrill Center., MD as PCP - General (Family Medicine)  PREOP DIAGNOSIS: Colon cancer at cecum  POSTOP DIAGNOSIS: Same  PROCEDURE: Laparoscopic right hemicolectomy  SURGEON: Sharon Mt. Kimball Manske, MD  ASSISTANT: Leighton Ruff, MD  ANESTHESIA: General endotracheal  EBL: 50 mL Total I/O In: 1000 [I.V.:1000] Out: 250 [Urine:200; Blood:50]  DRAINS: None  SPECIMEN: Right colon (terminal ileum, cecum, appendix, ascending, proximal transverse colon)  COUNTS: Sponge, needle and instrument counts were reported correct x2  FINDINGS: No evidence of metastatic disease on peritoneum or noted on liver. Normal appearing gallbladder. Adhesions between omentum and RUQ abdominal wall; omentum and ascending colon; potentially prior appendicitis as there was significant scar tissue in right lower quadrant although appendix was normal appearing. Mass in cecum. Right hemicolectomy with side to side ileocolic anastomosis.   STATEMENT OF MEDICAL NECESSITY: [name] is a 63 y.o. male with hx of HTN, GERD -recently found on colonoscopy to have a large polyp in the cecum overlying the appendiceal orifice with central depression that was biopsy-proven cancer.  He also had a 2 to 3 cm polyp in the ascending colon wraparound a fold that was sessile directly across from the ileocecal valve.  This was left in situ at time of his colonoscopy.  Additional diminutive polyps that were removed and returned as tubular adenomas.  We had seen him in the office.  He had undergone CT chest/abdomen/pelvis which demonstrated the known mass in the cecum with wall thickening at that location on the medial side but no evidence of metastatic disease in the abdomen or chest.  We discussed options with him moving forward and he opted to pursue surgery.  Please refer to notes elsewhere for details regarding this discussion   NARRATIVE:  The patient was identified &  brought into the operating room, placed supine on the operating table and SCDs were applied to the lower extremities. General endotracheal anesthesia was induced. The patient was positioned supine with arms tucked. Antibiotics were administered. A foley catheter was placed under sterile conditions. Hair in the region of planned surgery was clipped. The abdomen was prepped and draped in a sterile fashion. A timeout was performed confirming our patient and plan.   Beginning with the extraction port, a supraumbilical incision was made and carried down to the midline fascia. This was then incised with electrocautery. The peritoneum was identified and elevated between clamps and carefully opened sharply. A small Alexis wound protector with a cap and associated port was then placed. The abdomen was insufflated to 15 mmHg with CO2. A laparoscope was placed and camera inspection revealed no evidence of injury. Bilateral TAP blocks were then performed under laparoscopic visualization using a mixture of 0.25% marcaine with epinepherine + Exparel. 3 additional 5 mm ports were then placed under direct laparoscopic visualization - two in the left hemiabdomen and one in the right abdomen. The abdomen was surveyed. The liver and peritoneum appeared normal.  There were no signs of metastatic disease.  There were significant lesions between the omentum and abdominal wall in the right upper quadrant to that were taken down sharply.  There were adhesions between the terminal ileum and retroperitoneum that were carefully taken down sharply as well.  Care was taken to stay outside the retroperitoneum during this portion of the procedure.  There were also adhesions between the appendix and the terminal ileum that were carefully taken down sharply.  Following all of this, the  patient was positioned in trendelenburg with left side down.  A lateral to medial approach was employed first given the amount of adhesions in his right lower  quadrant.  The appendix and cecum were mobilized in a lateral to medial fashion by incising the Malayiah Mcbrayer line of Toldt.  This was taken up to the level of the hepatic flexure.  The associated mesocolon was reflected medially.  The duodenum was identified and protected free of injury.  He was then repositioned in gentle reverse Trendelenburg.  The omentum was carefully dissected free of the transverse colon where it was quite adherent.  This was over the proximal portion of the transverse colon.  Ultimately, the plane between the transverse mesocolon and the omentum was gained and developed out towards the hepatic flexure.  He had a relatively high hepatic flexure that went well up underneath his gallbladder.  His gallbladder did appear normal.  This plane was carefully developed and the hepatic flexure was ultimately completely released.  At this point, attention was given to division of the ileocolic pedicle.  The peritoneum overlying this was incised after the cecum was elevated anteriorly.  The ileocolic pedicle was then circumferentially dissected.  The plane behind it was well visualized.  The duodenum was identified and protected free of injury.  After the vessel was circumferentially dissected, it was then ligated using the Enseal device.  The pedicle was then observed and noted to be hemostatic.  The colon had been completely mobilized and the cecum reached into the left upper quadrant.  At this point, the abdomen was desufflated and the terminal ileum and right colon delivered through the wound protector. The distal ileum was then transected using a GIA blue load stapler.  A Babcock was then placed on the ileum to maintain orientation.  The remaining mesentery was divided using the Enseal device. The divided mesentery was inspected and noted to be hemostatic. The distal point of transection was then identified on the transverse colon at a location that included the right branch of the middle colics,  leaving the main middle colic feeding the remaining transverse colon. This was transected using another blue load GIA stapler.  The specimen was then passed off.  There was a palpable pulse in the transverse mesocolon at this level.  Attention was turned to creating the anastomosis. The terminal ileum and transverse colon were inspected for orientation to ensure no twisting nor bowel included in the mesenteric defect. An anastomosis was created between the terminal ileum and the transverse colon using a 75 mm GIA blue load stapler. The staple line was inspected and noted to be hemostatic.  The common enterotomy channel was closed using a TA 60 blue load stapler. Hemostasis was achieved at the staple line using 3-0 silk U-stitches. 3-0 silk sutures were used to imbricate the corners of the staple line as well.  A 2-0 silk suture was placed securing the "crotch" of the anastomosis. The anastomosis was palpated and noted to be widely patent. This was then placed back into the abdomen. The abdomen was then irrigated with sterile saline and hemostasis verified. The omentum was then brought down over the anastomosis and wound. The wound protector cap was replaced and CO2 reinsufflated. The laparoscopic ports were removed under direct visualization and the sites noted to be hemostatic. The Alexis wound protector was removed, sponge, needle, and instrument counts were reported correct, and we switched to clean instruments, gowns/gloves and drapes.   The supraumbilical fascia was then closed using two  running #1 PDS sutures.  The skin of all incision sites was closed with 4-0 monocryl subcuticular suture. Dermabond was placed on the port sites and a sterile dressing was placed over the abdominal incision. All counts were again reported correct. The patient was then awakened from anesthesia, extubated and transferred to a stretcher for transport to PACU in satisfactory condition.    DISPOSITION: PACU in satisfactory  condition

## 2019-03-15 NOTE — Discharge Instructions (Addendum)
POST OP INSTRUCTIONS AFTER COLON SURGERY  1. DIET: Be sure to include lots of fluids daily to stay hydrated - 64oz of water per day (8, 8 oz glasses).  Avoid fast food or heavy meals for the first couple of weeks as your are more likely to get nauseated. Avoid raw/uncooked fruits or vegetables and nuts for the first 4 weeks. If you have fruits/vegetables, make sure they are cooked until soft enough to mash on the roof of your mouth and chew your food well. Otherwise, diet as tolerated.  2. Take your usually prescribed home medications unless otherwise directed.  3. PAIN CONTROL: a. Pain is best controlled by a usual combination of three different methods TOGETHER: i. Ice/Heat ii. Over the counter pain medication iii. Prescription pain medication b. Most patients will experience some swelling and bruising around the surgical site.  Ice packs or heating pads (30-60 minutes up to 6 times a day) will help. Some people prefer to use ice alone, heat alone, alternating between ice & heat.  Experiment to what works for you.  Swelling and bruising can take several weeks to resolve.   c. It is helpful to take an over-the-counter pain medication regularly for the first few weeks: i. Ibuprofen (Motrin/Advil) - 200mg  tabs - take 3 tabs (600mg ) every 6 hours as needed for pain (unless you have been directed previously to avoid NSAIDs/ibuprofen) ii. Acetaminophen (Tylenol) - you may take 650mg  every 6 hours as needed. You can take this with motrin as they act differently on the body. If you are taking a narcotic pain medication that has acetaminophen in it, do not take over the counter tylenol at the same time. iii. NOTE: You may take both of these medications together - most patients  find it most helpful when alternating between the two (i.e. Ibuprofen at 6am, tylenol at 9am, ibuprofen at 12pm ...) d. A  prescription for pain medication should be given to you upon discharge.  Take your pain medication as  prescribed if your pain is not adequatly controlled with the over-the-counter pain reliefs mentioned above.  4. Avoid getting constipated.  Between the surgery and the pain medications, it is common to experience some constipation.  Increasing fluid intake and taking a fiber supplement (such as Metamucil, Citrucel, FiberCon, MiraLax, etc) 1-2 times a day regularly will usually help prevent this problem from occurring.  A mild laxative (prune juice, Milk of Magnesia, MiraLax, etc) should be taken according to package directions if there are no bowel movements after 48 hours.    5. Dressing: Your incisions are covered in Dermabond which is like sterile superglue for the skin. This will come off on it's own in a couple weeks. It is waterproof and you may shower normally with soap/water over wounds. Avoid baths/pools/lakes/oceans until your wounds have fully healed.   6. ACTIVITIES as tolerated:   a. Avoid heavy lifting (>10lbs or 1 gallon of milk) for the next 6 weeks. b. You may resume regular daily activities as tolerated--such as daily self-care, walking, climbing stairs--gradually increasing activities as tolerated.  If you can walk 30 minutes without difficulty, it is safe to try more intense activity such as jogging, treadmill, bicycling, low-impact aerobics.  c. DO NOT PUSH THROUGH PAIN.  Let pain be your guide: If it hurts to do something, don't do it. d. Dennis Bast may drive when you are no longer taking prescription pain medication, you can comfortably wear a seatbelt, and you can safely maneuver your car and apply  brakes. e. Dennis Bast may have sexual intercourse when it is comfortable.   7. FOLLOW UP in our office a. Please call [facility] at [phone] to set up an appointment to see your surgeon in the office for a follow-up appointment approximately 2 weeks after your surgery. b. Make sure that you call for this appointment the day you arrive home to insure a convenient appointment time.  9. If you have  disability or family leave forms that need to be completed, you may have them completed by your primary care physician's office; for return to work instructions, please ask our office staff and they will be happy to assist you in obtaining this documentation   When to call us [phone]: 1. Poor pain control 2. Reactions / problems with new medications (rash/itching, etc)  3. Fever over 101.5 F (38.5 C) 4. Inability to urinate 5. Nausea/vomiting 6. Worsening swelling or bruising 7. Continued bleeding from incision. 8. Increased pain, redness, or drainage from the incision  The clinic staff is available to answer your questions during regular business hours (8:30am-5pm).  Please dont hesitate to call and ask to speak to one of our nurses for clinical concerns.   A surgeon from [facility] is always on call at the hospitals   If you have a medical emergency, go to the nearest emergency room or call 911.  [facility], [place] [address], [place], [place], [place]  [address] MAIN: [phone] FAX: [phone] [email or web address]

## 2019-03-15 NOTE — Anesthesia Procedure Notes (Signed)
Procedure Name: Intubation Date/Time: 03/15/2019 12:38 PM Performed by: Adalberto Ill, CRNA Pre-anesthesia Checklist: Patient identified, Emergency Drugs available, Suction available, Patient being monitored and Timeout performed Patient Re-evaluated:Patient Re-evaluated prior to induction Oxygen Delivery Method: Circle system utilized Preoxygenation: Pre-oxygenation with 100% oxygen Induction Type: IV induction Ventilation: Mask ventilation without difficulty Laryngoscope Size: Miller and 2 Grade View: Grade I Tube type: Oral Tube size: 7.5 mm Number of attempts: 1 Airway Equipment and Method: Stylet Placement Confirmation: ETT inserted through vocal cords under direct vision,  positive ETCO2 and breath sounds checked- equal and bilateral Secured at: 22 cm Tube secured with: Tape Dental Injury: Teeth and Oropharynx as per pre-operative assessment

## 2019-03-15 NOTE — Interval H&P Note (Signed)
History and Physical Interval Note:  03/15/2019 12:13 PM  Isaiah Gray  has presented today for surgery, with the diagnosis of COLON CANCER-CECUM.  The various methods of treatment have been discussed with the patient and family. After consideration of risks, benefits and other options for treatment, the patient has consented to laparoscopic right hemicolectomy.  [name] is a very pleasant 66yoM with hx of HTN, GERD whom has been diagnosed with a cecal colon cancer and additionally 2-3 cm polyp adjacent to ileocecal valve CT C/A/P demonstrate no evidence of metastatic disease; cecal wall thickening noted consistent with known colon cancer in that location CEA 4.3 Alb 4.1  -The anatomy and physiology of the GI tract was discussed at length with the patient again today. The pathophysiology of colon polyps and colon cancer was discussed at length as well -We discussed laparoscopic and potential open techniques-right hemicolectomy. -The planned procedure, material risks (including, but not limited to, pain, bleeding, infection, scarring, need for blood transfusion, damage to surrounding structures- blood vessels/nerves/viscus/organs, damage to ureter, leak from anastomosis, need for additional procedures, need for stoma which may be permanent, hernia, recurrence of colon cancer, pneumonia, heart attack, stroke, death) benefits and alternatives to surgery were discussed at length. The patient's questions were answered to his satisfaction, he voiced understanding and elected to proceed with surgery. Additionally, we discussed typical postoperative expectations and the recovery process.  Nadeen Landau, M.D. [facility] Use [email or web address] to contact on call provider

## 2019-03-15 NOTE — Transfer of Care (Signed)
Immediate Anesthesia Transfer of Care Note  Patient: Isaiah Gray  Procedure(s) Performed: LAPAROSCOPIC RIGHT HEMICOLECTOMY (Right Abdomen)  Patient Location: PACU  Anesthesia Type:General  Level of Consciousness: awake, alert , oriented and patient cooperative  Airway & Oxygen Therapy: Patient Spontanous Breathing and Patient connected to face mask oxygen  Post-op Assessment: Report given to RN and Post -op Vital signs reviewed and stable  Post vital signs: Reviewed and stable  Last Vitals:  Vitals Value Taken Time  BP 172/94 03/15/19 1519  Temp    Pulse 85 03/15/19 1522  Resp 17 03/15/19 1522  SpO2 100 % 03/15/19 1522  Vitals shown include unvalidated device data.  Last Pain:  Vitals:   03/15/19 1059  TempSrc:   PainSc: 0-No pain         Complications: No apparent anesthesia complications

## 2019-03-15 NOTE — Anesthesia Preprocedure Evaluation (Signed)
Anesthesia Evaluation  Patient identified by MRN, date of birth, ID band Patient awake    Reviewed: Allergy & Precautions, H&P , NPO status , Patient's Chart, lab work & pertinent test results  Airway Mallampati: II   Neck ROM: full    Dental   Pulmonary neg pulmonary ROS, Patient abstained from smoking.,    breath sounds clear to auscultation       Cardiovascular hypertension,  Rhythm:regular Rate:Normal     Neuro/Psych    GI/Hepatic GERD  ,Colon CA   Endo/Other    Renal/GU      Musculoskeletal   Abdominal   Peds  Hematology   Anesthesia Other Findings   Reproductive/Obstetrics                             Anesthesia Physical Anesthesia Plan  ASA: II  Anesthesia Plan: General   Post-op Pain Management:    Induction: Intravenous  PONV Risk Score and Plan: 2 and Ondansetron, Dexamethasone, Midazolam and Treatment may vary due to age or medical condition  Airway Management Planned: Oral ETT  Additional Equipment:   Intra-op Plan:   Post-operative Plan: Extubation in OR  Informed Consent: I have reviewed the patients History and Physical, chart, labs and discussed the procedure including the risks, benefits and alternatives for the proposed anesthesia with the patient or authorized representative who has indicated his/her understanding and acceptance.       Plan Discussed with: CRNA, Anesthesiologist and Surgeon  Anesthesia Plan Comments:         Anesthesia Quick Evaluation

## 2019-03-16 ENCOUNTER — Encounter (HOSPITAL_COMMUNITY): Payer: Self-pay | Admitting: Surgery

## 2019-03-16 DIAGNOSIS — C180 Malignant neoplasm of cecum: Secondary | ICD-10-CM | POA: Diagnosis present

## 2019-03-16 DIAGNOSIS — K219 Gastro-esophageal reflux disease without esophagitis: Secondary | ICD-10-CM | POA: Diagnosis present

## 2019-03-16 DIAGNOSIS — I10 Essential (primary) hypertension: Secondary | ICD-10-CM | POA: Diagnosis present

## 2019-03-16 LAB — BASIC METABOLIC PANEL
Anion gap: 7 (ref 5–15)
BUN: 13 mg/dL (ref 8–23)
CO2: 24 mmol/L (ref 22–32)
Calcium: 8.7 mg/dL — ABNORMAL LOW (ref 8.9–10.3)
Chloride: 106 mmol/L (ref 98–111)
Creatinine, Ser: 1.16 mg/dL (ref 0.61–1.24)
GFR calc Af Amer: >60 mL/min (ref >60)
GFR calc non Af Amer: >60 mL/min (ref >60)
Glucose, Bld: 125 mg/dL — ABNORMAL HIGH (ref 70–99)
Potassium: 4.5 mmol/L (ref 3.5–5.1)
Sodium: 137 mmol/L (ref 135–145)

## 2019-03-16 LAB — CBC
HCT: 41.9 % (ref 39.0–52.0)
Hemoglobin: 13.9 g/dL (ref 13.0–17.0)
MCH: 30.5 pg (ref 26.0–34.0)
MCHC: 33.2 g/dL (ref 30.0–36.0)
MCV: 91.9 fL (ref 80.0–100.0)
Platelets: 243 10*3/uL (ref 150–400)
RBC: 4.56 MIL/uL (ref 4.22–5.81)
RDW: 12.3 % (ref 11.5–15.5)
WBC: 13.8 10*3/uL — ABNORMAL HIGH (ref 4.0–10.5)
nRBC: 0 % (ref 0.0–0.2)

## 2019-03-16 MED ORDER — LABETALOL HCL 5 MG/ML IV SOLN
2.5000 mg | Freq: Once | INTRAVENOUS | Status: AC
  Administered 2019-03-16: 2.5 mg via INTRAVENOUS
  Filled 2019-03-16: qty 4

## 2019-03-16 MED ORDER — CHLORHEXIDINE GLUCONATE CLOTH 2 % EX PADS
6.0000 | MEDICATED_PAD | Freq: Every day | CUTANEOUS | Status: DC
  Administered 2019-03-16: 6 via TOPICAL

## 2019-03-16 MED ORDER — ASPIRIN 81 MG PO CHEW
CHEWABLE_TABLET | ORAL | Status: AC
  Filled 2019-03-16: qty 1

## 2019-03-16 MED ORDER — ASPIRIN EC 81 MG PO TBEC
81.0000 mg | DELAYED_RELEASE_TABLET | Freq: Every day | ORAL | Status: DC
  Administered 2019-03-16 – 2019-03-17 (×2): 81 mg via ORAL
  Filled 2019-03-16 (×3): qty 1

## 2019-03-16 NOTE — Progress Notes (Signed)
MD notified of pts bp  184/99. Orders given.  Isaiah Gray

## 2019-03-16 NOTE — Progress Notes (Signed)
OHN BRINKLEY [id number] 1956-01-18  CARE TEAM:  PCP: Derrill Center., MD  Outpatient Care Team: Patient Care Team: Derrill Center., MD as PCP - General (Family Medicine)  Inpatient Treatment Team: Treatment Team: Attending Provider: Ileana Roup, MD; Registered Nurse: Guadlupe Spanish, RN   Problem List:   Principal Problem:   Primary cancer of cecum s/p lap right proximal colectomy 03/15/2019 Active Problems:   S/P right hemicolectomy   Benign essential hypertension   GERD (gastroesophageal reflux disease)   Cataract   1 Day Post-Op  03/15/2019  PREOP DIAGNOSIS: Colon cancer at cecum  POSTOP DIAGNOSIS: Same  PROCEDURE: Laparoscopic right hemicolectomy  SURGEON: Sharon Mt. White, MD    Assessment  Recovering  [facility] Stay = 1 days)  Plan:  -Liquid diet.  Advance per ERAS recovery protocol -Stop IV fluids per protocol.  IV fluid bolus backup. -Hypertension control.  Cozaar with as needed backup -GERD.  Resume PPI. -Follow-up on pathology -Resume low-dose aspirin -VTE prophylaxis- SCDs, etc -mobilize as tolerated to help recovery  20 minutes spent in review, evaluation, examination, counseling, and coordination of care.  More than 50% of that time was spent in counseling.  03/16/2019    Subjective: (Chief complaint)  Feels good.  Walked in hallways.  Tolerated full liquids.  Hoping to go home tomorrow.  Nursing in room.  Objective:  Vital signs:  Vitals:   03/15/19 1954 03/16/19 0225 03/16/19 0620 03/16/19 0625  BP: (!) 151/92 (!) 174/80  (!) 167/79  Pulse: 68 84  86  Resp: 14 14  14   Temp:  97.8 F (36.6 C)  97.6 F (36.4 C)  TempSrc:  Oral  Oral  SpO2: 99% 100%  100%  Weight:   82.3 kg   Height:        Last BM Date: 03/14/19  Intake/Output   Yesterday:  11/06 0701 - 11/07 0700 In: 3772.3 [P.O.:600; I.V.:3072.3; IV Piggyback:100] Out: [id number] [Urine:3475; Blood:50] This shift:  No intake/output data  recorded.  Bowel function:  Flatus: No  BM:  No  Drain: (No drain)   Physical Exam:  General: Pt awake/alert/oriented x4 in no acute distress.  Alert.  Relaxed.  Smiling. Eyes: PERRL, normal EOM.  Sclera clear.  No icterus Neuro: CN II-XII intact w/o focal sensory/motor deficits. Lymph: No head/neck/groin lymphadenopathy Psych:  No delerium/psychosis/paranoia HENT: Normocephalic, Mucus membranes moist.  No thrush Neck: Supple, No tracheal deviation Chest: No chest wall pain w good excursion CV:  Pulses intact.  Regular rhythm MS: Normal AROM mjr joints.  No obvious deformity  Abdomen: Soft.  Mildy distended.  Mildly tender at incisions only.  No evidence of peritonitis.  No incarcerated hernias.  Ext:  No deformity.  No mjr edema.  No cyanosis Skin: No petechiae / purpura  Results:   Cultures: Recent Results (from the past 720 hour(s))  Novel Coronavirus, NAA (Hosp order, Send-out to Ref Lab; TAT 18-24 hrs     Status: None   Collection Time: 03/12/19  3:14 PM   Specimen: Nasopharyngeal Swab; Respiratory  Result Value Ref Range Status   SARS-CoV-2, NAA NOT DETECTED NOT DETECTED Final    Comment: (NOTE) This nucleic acid amplification test was developed and its performance characteristics determined by [employer]. Nucleic acid amplification tests include PCR and TMA. This test has not been FDA cleared or approved. This test has been authorized by FDA under an Emergency Use Authorization (EUA). This test is only authorized for the duration of time the declaration  that circumstances exist justifying the authorization of the emergency use of in vitro diagnostic tests for detection of SARS-CoV-2 virus and/or diagnosis of COVID-19 infection under section 564(b)(1) of the Act, 21 U.S.C. [id number]) (1), unless the authorization is terminated or revoked sooner. When diagnostic testing is negative, the possibility of a false negative result should be considered in the  context of a patient's recent exposures and the presence of clinical signs and symptoms consistent with COVID-19. An individual without symptoms of COVID- 19 and who is not shedding SARS-CoV-2 vi rus would expect to have a negative (not detected) result in this assay. Performed At: [facility] [address] [place], [place] [id number] Rush Farmer MD [id number]    [place]  Final    Comment: Performed at [facility], [place] [address], [place], [place] [address]    Labs: Results for orders placed or performed during the hospital encounter of 03/15/19 (from the past 48 hour(s))  Basic metabolic panel     Status: Abnormal   Collection Time: 03/16/19  3:58 AM  Result Value Ref Range   Sodium 137 135 - 145 mmol/L   Potassium 4.5 3.5 - 5.1 mmol/L   Chloride 106 98 - 111 mmol/L   CO2 24 22 - 32 mmol/L   Glucose, Bld 125 (H) 70 - 99 mg/dL   BUN 13 8 - 23 mg/dL   Creatinine, Ser 1.16 0.61 - 1.24 mg/dL   Calcium 8.7 (L) 8.9 - 10.3 mg/dL   GFR calc non Af Amer >60 >60 mL/min   GFR calc Af Amer >60 >60 mL/min   Anion gap 7 5 - 15    Comment: Performed at [facility], [place] [address] [place], [place] [address]  CBC     Status: Abnormal   Collection Time: 03/16/19  3:58 AM  Result Value Ref Range   WBC 13.8 (H) 4.0 - 10.5 K/uL   RBC 4.56 4.22 - 5.81 MIL/uL   Hemoglobin 13.9 13.0 - 17.0 g/dL   HCT 41.9 39.0 - 52.0 %   MCV 91.9 80.0 - 100.0 fL   MCH 30.5 26.0 - 34.0 pg   MCHC 33.2 30.0 - 36.0 g/dL   RDW 12.3 11.5 - 15.5 %   Platelets 243 150 - 400 K/uL   nRBC 0.0 0.0 - 0.2 %    Comment: Performed at [facility], [place] [address] [place], [place] [address]    Imaging / Studies: No results found.  Medications / Allergies: per chart  Antibiotics: Anti-infectives (From admission, onward)   Start     Dose/Rate Route Frequency Ordered Stop   03/15/19 1400  neomycin (MYCIFRADIN) tablet 1,000 mg   Status:  Discontinued     1,000 mg Oral 3 times per day 03/15/19 1051 03/15/19 1056   03/15/19 1400  metroNIDAZOLE (FLAGYL) tablet 1,000 mg  Status:  Discontinued     1,000 mg Oral 3 times per day 03/15/19 1051 03/15/19 1056   03/15/19 1100  cefoTEtan (CEFOTAN) 2 g in sodium chloride 0.9 % 100 mL IVPB     2 g 200 mL/hr over 30 Minutes Intravenous On call to O.R. 03/15/19 1051 03/15/19 1303        Note: Portions of this report may have been transcribed using voice recognition software. Every effort was made to ensure accuracy; however, inadvertent computerized transcription errors may be present.   Any transcriptional errors that result from this process are unintentional.  Adin Hector, MD, FACS, MASCRS Gastrointestinal and Minimally Invasive Surgery    [address], [place], [place] [address] [phone] Main / Paging [phone] Fax

## 2019-03-17 LAB — BASIC METABOLIC PANEL
Anion gap: 10 (ref 5–15)
BUN: 13 mg/dL (ref 8–23)
CO2: 24 mmol/L (ref 22–32)
Calcium: 8.8 mg/dL — ABNORMAL LOW (ref 8.9–10.3)
Chloride: 105 mmol/L (ref 98–111)
Creatinine, Ser: 0.98 mg/dL (ref 0.61–1.24)
GFR calc Af Amer: >60 mL/min (ref >60)
GFR calc non Af Amer: >60 mL/min (ref >60)
Glucose, Bld: 112 mg/dL — ABNORMAL HIGH (ref 70–99)
Potassium: 3.7 mmol/L (ref 3.5–5.1)
Sodium: 139 mmol/L (ref 135–145)

## 2019-03-17 LAB — CBC
HCT: 40.1 % (ref 39.0–52.0)
Hemoglobin: 13.2 g/dL (ref 13.0–17.0)
MCH: 30.4 pg (ref 26.0–34.0)
MCHC: 32.9 g/dL (ref 30.0–36.0)
MCV: 92.4 fL (ref 80.0–100.0)
Platelets: 208 10*3/uL (ref 150–400)
RBC: 4.34 MIL/uL (ref 4.22–5.81)
RDW: 12.3 % (ref 11.5–15.5)
WBC: 12.4 10*3/uL — ABNORMAL HIGH (ref 4.0–10.5)
nRBC: 0 % (ref 0.0–0.2)

## 2019-03-17 MED ORDER — LABETALOL HCL 5 MG/ML IV SOLN
2.5000 mg | INTRAVENOUS | Status: DC | PRN
  Filled 2019-03-17: qty 4

## 2019-03-17 NOTE — Discharge Summary (Signed)
Physician Discharge Summary    Patient ID: Isaiah Gray MRN: [id number] DOB/AGE: [birth year]  63 y.o.  Patient Care Team: Derrill Center., MD as PCP - General (Family Medicine)  Admit date: 03/15/2019  Discharge date: 03/17/2019  Hospital Stay = 2 days    Discharge Diagnoses:  Principal Problem:   Primary cancer of cecum s/p lap right proximal colectomy 03/15/2019 Active Problems:   S/P right hemicolectomy   Benign essential hypertension   GERD (gastroesophageal reflux disease)   Cataract   2 Days Post-Op  03/15/2019  POST-OPERATIVE DIAGNOSIS:   COLON CANCER-CECUM  SURGERY:  03/15/2019  Procedure(s): LAPAROSCOPIC RIGHT HEMICOLECTOMY  SURGEON:    Surgeon(s): Ileana Roup, MD Leighton Ruff, MD  Consults: None  Hospital Course:   The patient underwent the surgery above.  Postoperatively, the patient gradually mobilized and advanced to a solid diet.  Pain and other symptoms were treated aggressively.    By the time of discharge, the patient was walking well the hallways, eating food, having flatus.  Pain was well-controlled on an oral medications.  Based on meeting discharge criteria and continuing to recover, I felt it was safe for the patient to be discharged from the hospital to further recover with close followup. Postoperative recommendations were discussed in detail.  They are written as well.  Discharged Condition: good  Discharge Exam: Blood pressure (!) 169/95, pulse 81, temperature 98 F (36.7 C), temperature source Oral, resp. rate 20, height 5\' 10"  (1.778 m), weight 80.7 kg, SpO2 98 %.  General: Pt awake/alert/oriented x4 in No acute distress Eyes: PERRL, normal EOM.  Sclera clear.  No icterus Neuro: CN II-XII intact w/o focal sensory/motor deficits. Lymph: No head/neck/groin lymphadenopathy Psych:  No delerium/psychosis/paranoia HENT: Normocephalic, Mucus membranes moist.  No thrush Neck: Supple, No tracheal deviation Chest:  No chest  wall pain w good excursion CV:  Pulses intact.  Regular rhythm MS: Normal AROM mjr joints.  No obvious deformity Abdomen: Soft.  Nondistended.  Mildly tender at incisions only.  No evidence of peritonitis.  No incarcerated hernias. Ext:  SCDs BLE.  No mjr edema.  No cyanosis Skin: No petechiae / purpura   Disposition:   Follow-up Information    Ileana Roup, MD Follow up in 2 week(s).   Specialty: General Surgery Contact information: [place] [address] [phone]             Discharge Instructions    Call MD for:   Complete by: As directed    FEVER > 101.5 F  (temperatures < 101.5 F are not significant)   Call MD for:  extreme fatigue   Complete by: As directed    Call MD for:  persistant dizziness or light-headedness   Complete by: As directed    Call MD for:  persistant nausea and vomiting   Complete by: As directed    Call MD for:  redness, tenderness, or signs of infection (pain, swelling, redness, odor or green/yellow discharge around incision site)   Complete by: As directed    Call MD for:  severe uncontrolled pain   Complete by: As directed    Diet - low sodium heart healthy   Complete by: As directed    Start with a bland diet such as soups, liquids, starchy foods, low fat foods, etc. the first few days at home. Gradually advance to a solid, low-fat, high fiber diet by the end of the first week at home.   Add a fiber  supplement to your diet (Metamucil, etc) If you feel full, bloated, or constipated, stay on a full liquid or pureed/blenderized diet for a few days until you feel better and are no longer constipated.   Discharge instructions   Complete by: As directed    See Discharge Instructions If you are not getting better after two weeks or are noticing you are getting worse, contact our office [phone] for further advice.  We may need to adjust your medications, re-evaluate you in the office, send you to the emergency  room, or see what other things we can do to help. The clinic staff is available to answer your questions during regular business hours (8:30am-5pm).  Please don't hesitate to call and ask to speak to one of our nurses for clinical concerns.    A surgeon from [facility] is always on call at the hospitals 24 hours/day If you have a medical emergency, go to the nearest emergency room or call 911.   Discharge wound care:   Complete by: As directed    It is good for closed incisions and even open wounds to be washed every day.  Shower every day.  Short baths are fine.  Wash the incisions and wounds clean with soap & water.    You may leave closed incisions open to air if it is dry.   You may cover the incision with clean gauze & replace it after your daily shower for comfort.  DERMABOND:  You have purple skin glue (Dermabond) on your incision(s).  Leave them in place, and they will fall off on their own like a scab.  You may trim any edges that curl up with clean scissors.   Driving Restrictions   Complete by: As directed    You may drive when: - you are no longer taking narcotic prescription pain medication - you can comfortably wear a seatbelt - you can safely make sudden turns/stops without pain.   Increase activity slowly   Complete by: As directed    Start light daily activities --- self-care, walking, climbing stairs- beginning the day after surgery.  Gradually increase activities as tolerated.  Control your pain to be active.  Stop when you are tired.  Ideally, walk several times a day, eventually an hour a day.   Most people are back to most day-to-day activities in a few weeks.  It takes 4-6 weeks to get back to unrestricted, intense activity. If you can walk 30 minutes without difficulty, it is safe to try more intense activity such as jogging, treadmill, bicycling, low-impact aerobics, swimming, etc. Save the most intensive and strenuous activity for last (Usually 4-8 weeks  after surgery) such as sit-ups, heavy lifting, contact sports, etc.  Refrain from any intense heavy lifting or straining until you are off narcotics for pain control.  You will have off days, but things should improve week-by-week. DO NOT PUSH THROUGH PAIN.  Let pain be your guide: If it hurts to do something, don't do it.   Lifting restrictions   Complete by: As directed    If you can walk 30 minutes without difficulty, it is safe to try more intense activity such as jogging, treadmill, bicycling, low-impact aerobics, swimming, etc. Save the most intensive and strenuous activity for last (Usually 4-8 weeks after surgery) such as sit-ups, heavy lifting, contact sports, etc.   Refrain from any intense heavy lifting or straining until you are off narcotics for pain control.  You will have off days,  but things should improve week-by-week. DO NOT PUSH THROUGH PAIN.  Let pain be your guide: If it hurts to do something, don't do it.  Pain is your body warning you to avoid that activity for another week until the pain goes down.   May shower / Bathe   Complete by: As directed    May walk up steps   Complete by: As directed    Remove dressing in 72 hours   Complete by: As directed    Make sure all dressings are removed by the third day after surgery = Monday 11/9.Marland Kitchen  Leave incisions open to air.  OK to cover incisions with gauze or bandages as desired   Sexual Activity Restrictions   Complete by: As directed    You may have sexual intercourse when it is comfortable. If it hurts to do something, stop.      Allergies as of 03/17/2019   No Known Allergies     Medication List    TAKE these medications   aspirin EC 81 MG tablet Take 81 mg by mouth daily.   losartan 50 MG tablet Commonly known as: COZAAR Take 50 mg by mouth daily.   omeprazole 20 MG tablet Commonly known as: PRILOSEC OTC Take 1 tablet (20 mg total) by mouth 2 (two) times daily.   RED YEAST RICE PO Take 2 capsules by mouth 2  (two) times daily.   sildenafil 20 MG tablet Commonly known as: REVATIO Take 20 mg by mouth daily as needed (ED).   sucralfate 1 GM/10ML suspension Commonly known as: CARAFATE Take 10 mLs (1 g total) by mouth every 6 (six) hours as needed.   traMADol 50 MG tablet Commonly known as: Ultram Take 1 tablet (50 mg total) by mouth every 6 (six) hours as needed for up to 5 days (postop pain not controlled with tylenol and ibuprofen).            Discharge Care Instructions  (From admission, onward)         Start     Ordered   03/17/19 0000  Discharge wound care:    Comments: It is good for closed incisions and even open wounds to be washed every day.  Shower every day.  Short baths are fine.  Wash the incisions and wounds clean with soap & water.    You may leave closed incisions open to air if it is dry.   You may cover the incision with clean gauze & replace it after your daily shower for comfort.  DERMABOND:  You have purple skin glue (Dermabond) on your incision(s).  Leave them in place, and they will fall off on their own like a scab.  You may trim any edges that curl up with clean scissors.   03/17/19 0935          Significant Diagnostic Studies:  Results for orders placed or performed during the hospital encounter of 03/15/19 (from the past 72 hour(s))  Basic metabolic panel     Status: Abnormal   Collection Time: 03/16/19  3:58 AM  Result Value Ref Range   Sodium 137 135 - 145 mmol/L   Potassium 4.5 3.5 - 5.1 mmol/L   Chloride 106 98 - 111 mmol/L   CO2 24 22 - 32 mmol/L   Glucose, Bld 125 (H) 70 - 99 mg/dL   BUN 13 8 - 23 mg/dL   Creatinine, Ser 1.16 0.61 - 1.24 mg/dL   Calcium 8.7 (L) 8.9 - 10.3 mg/dL  GFR calc non Af Amer >60 >60 mL/min   GFR calc Af Amer >60 >60 mL/min   Anion gap 7 5 - 15    Comment: Performed at [facility], [place] [address], [place], [place] [address]  CBC     Status: Abnormal   Collection Time: 03/16/19  3:58 AM   Result Value Ref Range   WBC 13.8 (H) 4.0 - 10.5 K/uL   RBC 4.56 4.22 - 5.81 MIL/uL   Hemoglobin 13.9 13.0 - 17.0 g/dL   HCT 41.9 39.0 - 52.0 %   MCV 91.9 80.0 - 100.0 fL   MCH 30.5 26.0 - 34.0 pg   MCHC 33.2 30.0 - 36.0 g/dL   RDW 12.3 11.5 - 15.5 %   Platelets 243 150 - 400 K/uL   nRBC 0.0 0.0 - 0.2 %    Comment: Performed at [facility], [place] [address], [place], [place] [id number]  Basic metabolic panel     Status: Abnormal   Collection Time: 03/17/19  3:39 AM  Result Value Ref Range   Sodium 139 135 - 145 mmol/L   Potassium 3.7 3.5 - 5.1 mmol/L    Comment: DELTA CHECK NOTED   Chloride 105 98 - 111 mmol/L   CO2 24 22 - 32 mmol/L   Glucose, Bld 112 (H) 70 - 99 mg/dL   BUN 13 8 - 23 mg/dL   Creatinine, Ser 0.98 0.61 - 1.24 mg/dL   Calcium 8.8 (L) 8.9 - 10.3 mg/dL   GFR calc non Af Amer >60 >60 mL/min   GFR calc Af Amer >60 >60 mL/min   Anion gap 10 5 - 15    Comment: Performed at [facility], [place] [address] [place], [place] [address]  CBC     Status: Abnormal   Collection Time: 03/17/19  3:39 AM  Result Value Ref Range   WBC 12.4 (H) 4.0 - 10.5 K/uL   RBC 4.34 4.22 - 5.81 MIL/uL   Hemoglobin 13.2 13.0 - 17.0 g/dL   HCT 40.1 39.0 - 52.0 %   MCV 92.4 80.0 - 100.0 fL   MCH 30.4 26.0 - 34.0 pg   MCHC 32.9 30.0 - 36.0 g/dL   RDW 12.3 11.5 - 15.5 %   Platelets 208 150 - 400 K/uL   nRBC 0.0 0.0 - 0.2 %    Comment: Performed at [facility], [place] [address] [place], [place] [address]    No results found.  Past Medical History:  Diagnosis Date  . Cataract 2015   right ecl with lens inplant  . GERD (gastroesophageal reflux disease)   . HTN (hypertension)   . Primary cancer of cecum ([place]) dx'd 01/25/19    Past Surgical History:  Procedure Laterality Date  . ENDOSCOPIC VEIN LASER TREATMENT Bilateral 2020   legs   . INGUINAL HERNIA REPAIR Right    age 60  . MENISCUS REPAIR Bilateral   . VARICOSE VEIN SURGERY  Right     Social History   Socioeconomic History  . Marital status: Married    Spouse name: Not on file  . Number of children: 2  . Years of education: Not on file  . Highest education level: Not on file  Occupational History  . Occupation: Cabin crew  Social Needs  . Financial resource strain: Not on file  . Food insecurity    Worry: Not on file    Inability: Not on file  . Transportation needs  Medical: Not on file    Non-medical: Not on file  Tobacco Use  . Smoking status: Never Smoker  . Smokeless tobacco: Never Used  Substance and Sexual Activity  . Alcohol use: Yes    Comment: weekends  . Drug use: Yes    Types: Marijuana    Comment: 03-12-2019 , last use x 1 month ago   . Sexual activity: Not on file  Lifestyle  . Physical activity    Days per week: Not on file    Minutes per session: Not on file  . Stress: Not on file  Relationships  . Social Herbalist on phone: Not on file    Gets together: Not on file    Attends religious service: Not on file    Active member of club or organization: Not on file    Attends meetings of clubs or organizations: Not on file    Relationship status: Not on file  . Intimate partner violence    Fear of current or ex partner: Not on file    Emotionally abused: Not on file    Physically abused: Not on file    Forced sexual activity: Not on file  Other Topics Concern  . Not on file  Social History Narrative  . Not on file    Family History  Problem Relation Age of Onset  . Hypertension Mother   . Hypertension Father   . Colon cancer Maternal Grandmother 86  . Cancer Paternal Grandmother        ? intestinal    Current Facility-Administered Medications  Medication Dose Route Frequency Provider Last Rate Last Dose  . 0.9 %  sodium chloride infusion  500 mL Intravenous Once Armbruster, Carlota Raspberry, MD       Current Outpatient Medications  Medication Sig Dispense Refill  . aspirin EC 81 MG tablet Take 81 mg  by mouth daily.     Marland Kitchen losartan (COZAAR) 50 MG tablet Take 50 mg by mouth daily.    Marland Kitchen omeprazole (PRILOSEC OTC) 20 MG tablet Take 1 tablet (20 mg total) by mouth 2 (two) times daily. 60 tablet 3  . Red Yeast Rice Extract (RED YEAST RICE PO) Take 2 capsules by mouth 2 (two) times daily.    . sildenafil (REVATIO) 20 MG tablet Take 20 mg by mouth daily as needed (ED).    . sucralfate (CARAFATE) 1 GM/10ML suspension Take 10 mLs (1 g total) by mouth every 6 (six) hours as needed. (Patient not taking: Reported on 01/25/2019) 420 mL 1  . traMADol (ULTRAM) 50 MG tablet Take 1 tablet (50 mg total) by mouth every 6 (six) hours as needed for up to 5 days (postop pain not controlled with tylenol and ibuprofen). 15 tablet 0     No Known Allergies  Signed: Morton Peters, MD, FACS, MASCRS Gastrointestinal and Minimally Invasive Surgery  [facility] [address], [place], [place] [address] [phone] Main / Paging [phone] Fax     03/17/2019, 3:51 PM

## 2019-03-17 NOTE — Plan of Care (Signed)
Pt was discharged home today. Instructions were reviewed with patient, and questions were answered. Pt was taken to main entrance via wheelchair by NT.  

## 2019-03-17 NOTE — Anesthesia Postprocedure Evaluation (Signed)
Anesthesia Post Note  Patient: Isaiah Gray  Procedure(s) Performed: LAPAROSCOPIC RIGHT HEMICOLECTOMY (Right Abdomen)     Patient location during evaluation: PACU Anesthesia Type: General Level of consciousness: awake and alert Pain management: pain level controlled Vital Signs Assessment: post-procedure vital signs reviewed and stable Respiratory status: spontaneous breathing, nonlabored ventilation, respiratory function stable and patient connected to nasal cannula oxygen Cardiovascular status: blood pressure returned to baseline and stable Postop Assessment: no apparent nausea or vomiting Anesthetic complications: no    Last Vitals:  Vitals:   03/17/19 0206 03/17/19 0555  BP: (!) 155/89 (!) 169/95  Pulse: 79 81  Resp: 18 20  Temp: 37.1 C 36.7 C  SpO2:  98%    Last Pain:  Vitals:   03/17/19 0555  TempSrc: Oral  PainSc:                  [name]

## 2019-03-17 NOTE — Progress Notes (Signed)
Isaiah Gray [id number] 07-30-1955  CARE TEAM:  PCP: Derrill Center., MD  Outpatient Care Team: Patient Care Team: Derrill Center., MD as PCP - General (Family Medicine)  Inpatient Treatment Team: Treatment Team: Attending Provider: Ileana Roup, MD; Registered Nurse: Guadlupe Spanish, RN; Registered Nurse: Charlyne Petrin, RN   Problem List:   Principal Problem:   Primary cancer of cecum s/p lap right proximal colectomy 03/15/2019 Active Problems:   S/P right hemicolectomy   Benign essential hypertension   GERD (gastroesophageal reflux disease)   Cataract   2 Days Post-Op  03/15/2019  PREOP DIAGNOSIS: Colon cancer at cecum  POSTOP DIAGNOSIS: Same  PROCEDURE: Laparoscopic right hemicolectomy  SURGEON: Sharon Mt. Dema Severin, MD    Assessment  Recovering  [facility] Stay = 2 days)  Plan:  -Tolerating soft diet.  Try heart healthy.  If tolerates, most likely can discharge later today. -ERAS recovery protocol -Stop IV fluids per protocol.  IV fluid bolus backup. -Hypertension control.  Needed labetalol once.  Otherwise fine.  Cozaar with as needed backup -GERD.  Resume PPI. -Follow-up on pathology -Resume low-dose aspirin -VTE prophylaxis- SCDs, etc -mobilize as tolerated to help recovery  20 minutes spent in review, evaluation, examination, counseling, and coordination of care.  More than 50% of that time was spent in counseling.  03/17/2019    Subjective: (Chief complaint)  Felt crampy yesterday but then had numerous loose bowel movements this morning.  Feeling much better.  Hungry.  Hoping to go home later today.  Objective:  Vital signs:  Vitals:   03/16/19 2206 03/17/19 0007 03/17/19 0206 03/17/19 0555  BP: (!) 184/99 (!) 168/81 (!) 155/89 (!) 169/95  Pulse:   79 81  Resp:   18 20  Temp:   98.8 F (37.1 C) 98 F (36.7 C)  TempSrc:   Oral Oral  SpO2:    98%  Weight:    80.7 kg  Height:        Last BM Date: 03/16/19   Intake/Output   Yesterday:  11/07 0701 - 11/08 0700 In: 860 [P.O.:860] Out: 2075 [Urine:2075] This shift:  No intake/output data recorded.  Bowel function:  Flatus: YES  BM:  YES  Drain: (No drain)   Physical Exam:  General: Pt awake/alert/oriented x4 in no acute distress.  Alert.  Relaxed.  Smiling. Eyes: PERRL, normal EOM.  Sclera clear.  No icterus Neuro: CN II-XII intact w/o focal sensory/motor deficits. Lymph: No head/neck/groin lymphadenopathy Psych:  No delerium/psychosis/paranoia HENT: Normocephalic, Mucus membranes moist.  No thrush Neck: Supple, No tracheal deviation Chest: No chest wall pain w good excursion CV:  Pulses intact.  Regular rhythm MS: Normal AROM mjr joints.  No obvious deformity  Abdomen: Soft.  Nondistended.  Mildly tender at incisions only.  No evidence of peritonitis.  No incarcerated hernias.  Ext:  No deformity.  No mjr edema.  No cyanosis Skin: No petechiae / purpura  Results:   Cultures: Recent Results (from the past 720 hour(s))  Novel Coronavirus, NAA (Hosp order, Send-out to Ref Lab; TAT 18-24 hrs     Status: None   Collection Time: 03/12/19  3:14 PM   Specimen: Nasopharyngeal Swab; Respiratory  Result Value Ref Range Status   SARS-CoV-2, NAA NOT DETECTED NOT DETECTED Final    Comment: (NOTE) This nucleic acid amplification test was developed and its performance characteristics determined by [employer]. Nucleic acid amplification tests include PCR and TMA. This test has not been FDA cleared or approved. This  test has been authorized by FDA under an Emergency Use Authorization (EUA). This test is only authorized for the duration of time the declaration that circumstances exist justifying the authorization of the emergency use of in vitro diagnostic tests for detection of SARS-CoV-2 virus and/or diagnosis of COVID-19 infection under section 564(b)(1) of the Act, 21 U.S.C. [id number]) (1), unless the authorization is  terminated or revoked sooner. When diagnostic testing is negative, the possibility of a false negative result should be considered in the context of a patient's recent exposures and the presence of clinical signs and symptoms consistent with COVID-19. An individual without symptoms of COVID- 19 and who is not shedding SARS-CoV-2 vi rus would expect to have a negative (not detected) result in this assay. Performed At: [facility] [address] [place], [place] [id number] Rush Farmer MD [id number]    [place]  Final    Comment: Performed at [facility], [place] [address], [place], [place] [address]    Labs: Results for orders placed or performed during the hospital encounter of 03/15/19 (from the past 48 hour(s))  Basic metabolic panel     Status: Abnormal   Collection Time: 03/16/19  3:58 AM  Result Value Ref Range   Sodium 137 135 - 145 mmol/L   Potassium 4.5 3.5 - 5.1 mmol/L   Chloride 106 98 - 111 mmol/L   CO2 24 22 - 32 mmol/L   Glucose, Bld 125 (H) 70 - 99 mg/dL   BUN 13 8 - 23 mg/dL   Creatinine, Ser 1.16 0.61 - 1.24 mg/dL   Calcium 8.7 (L) 8.9 - 10.3 mg/dL   GFR calc non Af Amer >60 >60 mL/min   GFR calc Af Amer >60 >60 mL/min   Anion gap 7 5 - 15    Comment: Performed at [facility], [place] [address] [place], [place] [address]  CBC     Status: Abnormal   Collection Time: 03/16/19  3:58 AM  Result Value Ref Range   WBC 13.8 (H) 4.0 - 10.5 K/uL   RBC 4.56 4.22 - 5.81 MIL/uL   Hemoglobin 13.9 13.0 - 17.0 g/dL   HCT 41.9 39.0 - 52.0 %   MCV 91.9 80.0 - 100.0 fL   MCH 30.5 26.0 - 34.0 pg   MCHC 33.2 30.0 - 36.0 g/dL   RDW 12.3 11.5 - 15.5 %   Platelets 243 150 - 400 K/uL   nRBC 0.0 0.0 - 0.2 %    Comment: Performed at [facility], [place] [address], [place], [place] [id number]  Basic metabolic panel     Status: Abnormal   Collection Time: 03/17/19  3:39 AM  Result Value Ref Range    Sodium 139 135 - 145 mmol/L   Potassium 3.7 3.5 - 5.1 mmol/L    Comment: DELTA CHECK NOTED   Chloride 105 98 - 111 mmol/L   CO2 24 22 - 32 mmol/L   Glucose, Bld 112 (H) 70 - 99 mg/dL   BUN 13 8 - 23 mg/dL   Creatinine, Ser 0.98 0.61 - 1.24 mg/dL   Calcium 8.8 (L) 8.9 - 10.3 mg/dL   GFR calc non Af Amer >60 >60 mL/min   GFR calc Af Amer >60 >60 mL/min   Anion gap 10 5 - 15    Comment: Performed at [facility], [place] [address], [place], [place] [address]  CBC     Status: Abnormal   Collection Time: 03/17/19  3:39 AM  Result Value Ref Range   WBC 12.4 (H) 4.0 - 10.5 K/uL   RBC 4.34 4.22 - 5.81 MIL/uL   Hemoglobin 13.2 13.0 - 17.0 g/dL   HCT 40.1 39.0 - 52.0 %   MCV 92.4 80.0 - 100.0 fL   MCH 30.4 26.0 - 34.0 pg   MCHC 32.9 30.0 - 36.0 g/dL   RDW 12.3 11.5 - 15.5 %   Platelets 208 150 - 400 K/uL   nRBC 0.0 0.0 - 0.2 %    Comment: Performed at [facility], [place] [address], [place], [place] [address]    Imaging / Studies: No results found.  Medications / Allergies: per chart  Antibiotics: Anti-infectives (From admission, onward)   Start     Dose/Rate Route Frequency Ordered Stop   03/15/19 1400  neomycin (MYCIFRADIN) tablet 1,000 mg  Status:  Discontinued     1,000 mg Oral 3 times per day 03/15/19 1051 03/15/19 1056   03/15/19 1400  metroNIDAZOLE (FLAGYL) tablet 1,000 mg  Status:  Discontinued     1,000 mg Oral 3 times per day 03/15/19 1051 03/15/19 1056   03/15/19 1100  cefoTEtan (CEFOTAN) 2 g in sodium chloride 0.9 % 100 mL IVPB     2 g 200 mL/hr over 30 Minutes Intravenous On call to O.R. 03/15/19 1051 03/15/19 1303        Note: Portions of this report may have been transcribed using voice recognition software. Every effort was made to ensure accuracy; however, inadvertent computerized transcription errors may be present.   Any transcriptional errors that result from this process are unintentional.     Adin Hector, MD,  FACS, MASCRS Gastrointestinal and Minimally Invasive Surgery    [address] [place], [place] [address] [phone] Main / Paging [phone] Fax

## 2019-03-18 ENCOUNTER — Encounter (HOSPITAL_COMMUNITY): Payer: Self-pay | Admitting: Surgery

## 2019-03-19 LAB — SURGICAL PATHOLOGY

## 2019-03-21 ENCOUNTER — Telehealth: Payer: Self-pay | Admitting: Oncology

## 2019-03-21 NOTE — Telephone Encounter (Signed)
A new patient appt has been scheduled for Mr. Nebergall to see Dr. Benay Spice on 11/19 at 2pm. Pt aware to arrive 15 minutes early.

## 2019-03-28 ENCOUNTER — Telehealth: Payer: Self-pay | Admitting: Oncology

## 2019-03-28 ENCOUNTER — Inpatient Hospital Stay: Payer: BC Managed Care – PPO | Attending: Oncology | Admitting: Oncology

## 2019-03-28 ENCOUNTER — Other Ambulatory Visit (HOSPITAL_COMMUNITY): Payer: Self-pay | Admitting: Surgery

## 2019-03-28 ENCOUNTER — Other Ambulatory Visit: Payer: Self-pay

## 2019-03-28 VITALS — BP 135/72 | HR 82 | Temp 98.2°F | Resp 17 | Ht 70.0 in | Wt 181.5 lb

## 2019-03-28 DIAGNOSIS — C189 Malignant neoplasm of colon, unspecified: Secondary | ICD-10-CM

## 2019-03-28 DIAGNOSIS — Z800 Family history of malignant neoplasm of digestive organs: Secondary | ICD-10-CM | POA: Diagnosis not present

## 2019-03-28 DIAGNOSIS — I10 Essential (primary) hypertension: Secondary | ICD-10-CM | POA: Insufficient documentation

## 2019-03-28 DIAGNOSIS — C180 Malignant neoplasm of cecum: Secondary | ICD-10-CM | POA: Diagnosis not present

## 2019-03-28 DIAGNOSIS — R1310 Dysphagia, unspecified: Secondary | ICD-10-CM | POA: Insufficient documentation

## 2019-03-28 MED ORDER — CAPECITABINE 500 MG PO TABS: ORAL_TABLET | ORAL | 0 refills | Status: DC

## 2019-03-28 NOTE — Progress Notes (Signed)
Requested by MD to assess venous access for #4 cycles of CAPOX. Venous access is adequate, however patient was warned that oxaliplatin is a vesicant and can cause pain during and for several days after infusion. He is leaning towards not having port at this time. Will f/u with him tomorrow regarding his decision. Surgeon needs to know soon.

## 2019-03-28 NOTE — Telephone Encounter (Signed)
Scheduled per 11/19 los, patient will receive updates on my chart. Waiting on add-on for 12/02.

## 2019-03-28 NOTE — Progress Notes (Signed)
START ON PATHWAY REGIMEN - Colorectal     A cycle is every 21 days:     Capecitabine      Oxaliplatin   **Always confirm dose/schedule in your pharmacy ordering system**  Patient Characteristics: Postoperative without Neoadjuvant Therapy (Pathologic Staging), Colon, Stage III, Low Risk (pT1-3, pN1) Tumor Location: Colon Therapeutic Status: Postoperative without Neoadjuvant Therapy (Pathologic Staging) AJCC M Category: Staged < 8th Ed. AJCC T Category: Staged < 8th Ed. AJCC N Category: Staged < 8th Ed. AJCC 8 Stage Grouping: Staged < 8th Ed. Intent of Therapy: Curative Intent, Discussed with Patient 

## 2019-03-28 NOTE — Progress Notes (Signed)
[facility] Patient Consult   Requesting MD: Nadeen Landau  Isaiah Gray 63 y.o.  1956/02/07    Reason for Consult: Colon cancer   HPI: Isaiah Gray reports developing dysphagia a few months ago.  The dysphagia improved when he started over-the-counter Prilosec.  He saw Dr. Havery Moros and was taken to upper and lower endoscopy procedures on 01/25/2019.  He last had a colonoscopy in [place] in 2012 and reports there was a "polyp "that was not removed.  He reports a subsequent colonoscopy revealed no polyp.  An upper endoscopy on 01/25/2019 revealed grade a esophagitis at the GE junction.  The Prilosec was increased to twice daily.  The reflux symptoms resolved.  A colonoscopy on 01/25/2019 revealed a large polyp in the cecum overlying the appendiceal orifice.  The lesion measured 2-3 cm in size.  A biopsy was obtained.  A 2-3 cm polyp was found in the ascending colon across from the ileocecal valve.  The polyp was not removed.  3 sessile polyps were removed from the transverse colon.  3 sessile polyps were found in the sigmoid colon.  The pathology revealed tubular adenomas involving the transverse and sigmoid polyps.  The cecum biopsy returned as adenocarcinoma.  CTs of the chest, abdomen, and pelvis 02/06/2019 revealed no evidence of metastatic disease to the chest.  A 4 mm triangular subpleural nodule was noted at the right middle lobe favored to represent a benign subpleural lymph node.  The liver appeared normal.  Eccentric wall thickening was noted at the medial wall of the cecum.  2 small ileocolic nodes measured up to 5 mm.  No suspicious abdominopelvic lymphadenopathy.  Prostamegaly.  Small fat-containing bilateral inguinal hernias.  He was referred to Dr. Alfonso Patten and was taken to the operating room on 03/15/2019 for a laparoscopic right hemicolectomy.  There was no evidence of metastatic disease to the peritoneum or liver.  Adhesions and scar tissue were noted in the  right lower quadrant.  A mass was seen in the cecum.  The pathology ([id number]) revealed an invasive moderate to poorly differentiated adenocarcinoma of the cecum.  Tumor invaded into pericolonic tissues.  The resection margins are negative.  Lymphovascular invasion was present.  2 of 17 lymph nodes contained metastatic carcinoma.  No macroscopic tumor perforation.  The tumor was staged as a pT3pN1b lesion.  A tubular adenoma was noted in the proximal ascending colon.  No loss of mismatch repair protein expression.  He was discharged home on 03/17/2019.  He is tolerating a diet and having bowel movements.    Past Medical History:  Diagnosis Date  . Cataract 2015   right ecl with lens inplant  . GERD (gastroesophageal reflux disease)   . HTN (hypertension)   . Primary cancer of cecum (HCC)-stage III (pT3pN1b) dx'd 01/25/19    Past Surgical History:  Procedure Laterality Date  . ENDOSCOPIC VEIN LASER TREATMENT Bilateral 2020   legs   . INGUINAL HERNIA REPAIR Right    age 70  . LAPAROSCOPIC PARTIAL COLECTOMY Right 03/15/2019   Procedure: LAPAROSCOPIC RIGHT HEMICOLECTOMY;  Surgeon: Ileana Roup, MD;  Location: [facility];  Service: General;  Laterality: Right;  . MENISCUS REPAIR Bilateral   . VARICOSE VEIN SURGERY Right     Medications: Reviewed  Allergies: No Known Allergies  Family history: His maternal grandmother had colon cancer at age 26.  A maternal cousin had colon cancer.  A maternal uncle may have had prostate cancer.  No other family  history of cancer.  Social History:   He lives with his wife in [place].  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  He does not use cigarettes.  He drinks alcohol on the weekends.  No transfusion history.  No risk factor for HIV or hepatitis.  ROS:   Positives include: Dysphagia prior to starting Prilosec fall 2020, red-tinged stool taking red yeast rice  A complete ROS was otherwise negative.  Physical Exam:  There were no vitals  taken for this visit.  HEENT: Neck without mass Lungs: Clear bilaterally Cardiac: Regular rate and rhythm Abdomen: No hepatosplenomegaly, nontender, no mass, small soft bilateral inguinal hernias  Vascular: No leg edema Lymph nodes: No cervical, supraclavicular, or inguinal nodes.  "Shotty "bilateral axillary nodes.  Right axillary fat pad versus soft 3-4 cm axillary mass Neurologic: Alert and oriented, the motor exam appears intact in the upper and lower extremities bilaterally Skin: Surgical incisions Musculoskeletal: No spine tenderness   LAB:  CBC  Lab Results  Component Value Date   WBC 12.4 (H) 03/17/2019   HGB 13.2 03/17/2019   HCT 40.1 03/17/2019   MCV 92.4 03/17/2019   PLT 208 03/17/2019   NEUTROABS 4.4 03/12/2019        CMP  Lab Results  Component Value Date   NA 139 03/17/2019   K 3.7 03/17/2019   CL 105 03/17/2019   CO2 24 03/17/2019   GLUCOSE 112 (H) 03/17/2019   BUN 13 03/17/2019   CREATININE 0.98 03/17/2019   CALCIUM 8.8 (L) 03/17/2019   PROT 6.8 03/12/2019   ALBUMIN 4.0 03/12/2019   AST 22 03/12/2019   ALT 21 03/12/2019   ALKPHOS 73 03/12/2019   BILITOT 0.8 03/12/2019   GFRNONAA >60 03/17/2019   GFRAA >60 03/17/2019   CEA on 01/30/2019: 4.3   Imaging:  As per HPI   Assessment/Plan:   1. Adenocarcinoma of the cecum, stage IIIb (T3N1B)   Right hemicolectomy 03/15/2019 moderate-poorly differentiated, lymphovascular invasion present, 2/17 lymph nodes positive, no loss of mismatch repair protein expression  Colonoscopy 01/25/2019-cecal mass, multiple polyps including the ascending, transverse, and sigmoid colon, pathology from the cecum-adenocarcinoma, pathology from the transverse and sigmoid polyps-tubular adenomas  Tubular adenoma at the proximal ascending colon on the right colectomy specimen  CTs 02/06/2019-mass at the medial wall of the cecum, 2 small ileocolic nodes measuring 5 mm, 4 mm irregular subpleural nodule in the anterior right  middle lobe-favored to be a benign subpleural lymph node  Elevated preoperative CEA 2. Gastroesophageal reflux 3. Hypertension 4.   Bilateral inguinal hernias on CT 02/06/19 5.   Family history of colon cancer 6.   Multiple tubular adenomas on the colonoscopy 01/25/2019 and a tubular adenoma on the right colon resection specimen   Disposition:   Isaiah Gray has been diagnosed with stage III colon cancer.  I reviewed the prognosis and adjuvant treatment options with Isaiah Gray and his wife.  We reviewed details of surgical pathology report.  I described the benefit associated with adjuvant fluorouracil and oxaliplatin based chemotherapy in patients with resected stage III colon cancer.  We discussed recent data supporting the noninferiority 3 months 6 months of adjuvant therapy in patients with early stage III disease.  He appears to be a candidate for 3 months of adjuvant therapy.  We discussed details of the FOLFOX and CAPOX regimens.  He prefers CAPOX.  I reviewed potential toxicities associated with the CAPOX regimen including the chance for nausea/vomiting, mucositis, alopecia, diarrhea, and hematologic toxicity.  We discussed  the rash, sun sensitivity, hyperpigmentation, and hand/foot syndrome associated with capecitabine.  We reviewed the allergic reaction and various types of neuropathy seen with oxaliplatin.  He agrees to proceed.  I explained the pain associated with oxaliplatin when given via a peripheral vein.  I recommend placement of a Port-A-Cath.  Isaiah Gray is undecided on whether he would like to try the first cycle with peripheral IV access.  We will contact him within the next few days to discuss this further.  He does not appear to have hereditary nonpolyposis colon cancer syndrome, but his family members are at increased risk of developing colorectal cancer and should receive appropriate screening.  We will request MSI testing on the resected tumor.  He should undergo a  surveillance colonoscopy in 1 year.  He will return for a chemotherapy teaching class on 04/05/2019.  Isaiah Gray will be scheduled for an office visit and the first cycle of CAPOX on 04/10/2019. Betsy Coder, MD  03/28/2019, 2:26 PM

## 2019-03-29 ENCOUNTER — Telehealth: Payer: Self-pay | Admitting: *Deleted

## 2019-03-29 ENCOUNTER — Telehealth: Payer: Self-pay | Admitting: Pharmacist

## 2019-03-29 ENCOUNTER — Encounter: Payer: Self-pay | Admitting: General Practice

## 2019-03-29 ENCOUNTER — Other Ambulatory Visit (HOSPITAL_COMMUNITY): Payer: Self-pay | Admitting: Surgery

## 2019-03-29 ENCOUNTER — Encounter (HOSPITAL_COMMUNITY): Payer: Self-pay | Admitting: Oncology

## 2019-03-29 DIAGNOSIS — C180 Malignant neoplasm of cecum: Secondary | ICD-10-CM

## 2019-03-29 MED ORDER — CAPECITABINE 500 MG PO TABS: ORAL_TABLET | ORAL | 0 refills | Status: DC

## 2019-03-29 NOTE — Progress Notes (Signed)
[facility] CSW Progress Notes  Call to patient's wife to offer support/resources at request of Merceda Elks, RN.  Provided information on resources available through [place] and [employer].  Please recontact as needed if additional resources are needed.  Edwyna Shell, LCSW Clinical Social Worker Phone:  [phone] Cell:  [phone]

## 2019-03-29 NOTE — Telephone Encounter (Signed)
Oral Oncology Pharmacist Encounter  Insurance authorization for Xeloda (capecitabine) tablets submitted to [employer] on Cover My Meds  Key: [id number] Status: pending  This encounter will continue to be updated until final determination.  Johny Drilling, PharmD, BCPS, BCOP  03/29/2019   10:52 AM Oral Oncology Clinic [phone]

## 2019-03-29 NOTE — Telephone Encounter (Signed)
Oral Oncology Pharmacist Encounter  Received new prescription for Xeloda (capecitabine) for the adjuvant treatment of stage III cancer of the cecum in conjunction with oxaliplatin, planned duration 3-6 months of adjuvant treatment (4-8 cycles planned).  Patient is s/p resection and hemicolectomy on 03/15/2019. He is now under evaluation to initiate adjuvant treatment with capecitabine administered at ~866 mg/m2 BID (2000 mg in AM and 1500mg  in PM) for 14 days on, 7 days off, repeated every 21 days Oxaliplatin will be administered at ~135 mg/m2 IV given once every 3 weeks Planned start date is 04/10/2019  Labs from Nov 2020 assessed, OK for treatment initiation.  Current medication list in Epic reviewed, moderate DDIs with capecitabine and omeprazole identified:  Category C interaction: concurrent use of a proton pump inhibitor (PPI) was associated with poorer progression free survival and overall survival among patient receiving capecitabine as part of their adjuvant treatment of gastric cancer. A retrospective analysis of patient receiving capecitabine for adjuvant treatment of colorectal cancer did not find detrimental effects on important efficacy outcomes with concurrent use of a PPI. Patient will be screened for ability to discontinue use of PPI. If unable to be discontinued, no change to current therapy is indicated at this time.  Prescription has been e-scribed to the [facility] for benefits analysis and approval.  Oral Oncology Clinic will continue to follow for insurance authorization, copayment issues, initial counseling and start date.  Johny Drilling, PharmD, BCPS, BCOP  03/29/2019 10:32 AM Oral Oncology Clinic [phone]

## 2019-03-29 NOTE — Telephone Encounter (Signed)
MSI testing requested on accession : [id number]

## 2019-03-29 NOTE — Telephone Encounter (Signed)
He Decided against port

## 2019-03-29 NOTE — Telephone Encounter (Signed)
Mr Feick has decided to take  IV chemo peripherally rather than port.

## 2019-04-02 NOTE — Telephone Encounter (Signed)
Oral Oncology Pharmacist Encounter  Insurance authorization for Xeloda (capecitabine) tablets submitted to [employer] on Cover My Meds  Key: [id number] Status: approved Effective dates: 03/29/2019 - 03/27/2020  Johny Drilling, PharmD, BCPS, BCOP  04/02/2019   9:51 AM Oral Oncology Clinic [phone]

## 2019-04-05 ENCOUNTER — Other Ambulatory Visit: Payer: Self-pay

## 2019-04-05 ENCOUNTER — Inpatient Hospital Stay: Payer: BC Managed Care – PPO

## 2019-04-05 DIAGNOSIS — I10 Essential (primary) hypertension: Secondary | ICD-10-CM | POA: Diagnosis not present

## 2019-04-05 DIAGNOSIS — C180 Malignant neoplasm of cecum: Secondary | ICD-10-CM | POA: Diagnosis present

## 2019-04-05 DIAGNOSIS — R1310 Dysphagia, unspecified: Secondary | ICD-10-CM | POA: Diagnosis not present

## 2019-04-05 DIAGNOSIS — Z800 Family history of malignant neoplasm of digestive organs: Secondary | ICD-10-CM | POA: Diagnosis not present

## 2019-04-05 LAB — CBC WITH DIFFERENTIAL (CANCER CENTER ONLY)
Abs Immature Granulocytes: 0.02 10*3/uL (ref 0.00–0.07)
Basophils Absolute: 0 10*3/uL (ref 0.0–0.1)
Basophils Relative: 1 %
Eosinophils Absolute: 0.1 10*3/uL (ref 0.0–0.5)
Eosinophils Relative: 1 %
HCT: 37.9 % — ABNORMAL LOW (ref 39.0–52.0)
Hemoglobin: 12.8 g/dL — ABNORMAL LOW (ref 13.0–17.0)
Immature Granulocytes: 0 %
Lymphocytes Relative: 18 %
Lymphs Abs: 1.2 10*3/uL (ref 0.7–4.0)
MCH: 30.5 pg (ref 26.0–34.0)
MCHC: 33.8 g/dL (ref 30.0–36.0)
MCV: 90.5 fL (ref 80.0–100.0)
Monocytes Absolute: 0.7 10*3/uL (ref 0.1–1.0)
Monocytes Relative: 12 %
Neutro Abs: 4.2 10*3/uL (ref 1.7–7.7)
Neutrophils Relative %: 68 %
Platelet Count: 249 10*3/uL (ref 150–400)
RBC: 4.19 MIL/uL — ABNORMAL LOW (ref 4.22–5.81)
RDW: 12.5 % (ref 11.5–15.5)
WBC Count: 6.3 10*3/uL (ref 4.0–10.5)
nRBC: 0 % (ref 0.0–0.2)

## 2019-04-05 LAB — CMP (CANCER CENTER ONLY)
ALT: 23 U/L (ref 0–44)
AST: 22 U/L (ref 15–41)
Albumin: 3.7 g/dL (ref 3.5–5.0)
Alkaline Phosphatase: 88 U/L (ref 38–126)
Anion gap: 11 (ref 5–15)
BUN: 23 mg/dL (ref 8–23)
CO2: 25 mmol/L (ref 22–32)
Calcium: 9 mg/dL (ref 8.9–10.3)
Chloride: 104 mmol/L (ref 98–111)
Creatinine: 1.17 mg/dL (ref 0.61–1.24)
GFR, Est AFR Am: >60 mL/min (ref >60)
GFR, Estimated: >60 mL/min (ref >60)
Glucose, Bld: 69 mg/dL — ABNORMAL LOW (ref 70–99)
Potassium: 4.3 mmol/L (ref 3.5–5.1)
Sodium: 140 mmol/L (ref 135–145)
Total Bilirubin: 0.7 mg/dL (ref 0.3–1.2)
Total Protein: 6.2 g/dL — ABNORMAL LOW (ref 6.5–8.1)

## 2019-04-05 LAB — CEA (IN HOUSE-CHCC): CEA (CHCC-In House): 1.14 ng/mL (ref 0.00–5.00)

## 2019-04-07 ENCOUNTER — Other Ambulatory Visit: Payer: Self-pay | Admitting: Oncology

## 2019-04-08 NOTE — Telephone Encounter (Signed)
Oral Chemotherapy Pharmacist Encounter   I spoke with patient for overview of: Xeloda (capecitabine) for the adjuvant treatment of stage III cancer of the cecum in conjunction with oxaliplatin, planned duration 3-6 months of adjuvant treatment (4-8 cycles planned).  Counseled patient on administration, dosing, side effects, monitoring, drug-food interactions, safe handling, storage, and disposal.  Patient will take Xeloda 500mg  tablets, 4 tablets (2000mg ) by mouth in AM and 3 tabs (1500mg ) by mouth in PM, within 30 minutes of finishing meals, on days 1-14 of each 21 day cycle.   Oxaliplatin will be infused on day 1 of each 21 day cycle.  Xeloda and oxaliplatin start date: 04/10/2019  Adverse effects include but are not limited to: fatigue, decreased blood counts, GI upset, diarrhea, mouth sores, and hand-foot syndrome.  Patient does need anti-emetic prescription.   Patient will obtain anti diarrheal and alert the office of 4 or more loose stools above baseline.  Reviewed with patient importance of keeping a medication schedule and plan for any missed doses.  Medication reconciliation performed and medication/allergy list updated.  Insurance authorization for Xeloda has been obtained. Test claim at the pharmacy revealed copayment $0 for 1st fill of Xeloda. Patient will pick this up from the [facility] on 04/09/19.  Patient informed the pharmacy will reach out 5-7 days prior to needing next fill of Xeloda to coordinate continued medication acquisition to prevent break in therapy.  All questions answered.  Isaiah Gray voiced understanding and appreciation.   Patient knows to call the office with questions or concerns.  Johny Drilling, PharmD, BCPS, BCOP  04/08/2019   12:57 PM Oral Oncology Clinic [phone]

## 2019-04-09 MED FILL — CAPECITABINE 500 MG TABS: 500 | 21 days supply | Qty: 98 | Fill #0

## 2019-04-10 ENCOUNTER — Other Ambulatory Visit: Payer: Self-pay

## 2019-04-10 ENCOUNTER — Inpatient Hospital Stay: Payer: BC Managed Care – PPO | Attending: Oncology | Admitting: Oncology

## 2019-04-10 ENCOUNTER — Other Ambulatory Visit: Payer: Self-pay | Admitting: *Deleted

## 2019-04-10 ENCOUNTER — Inpatient Hospital Stay: Payer: BC Managed Care – PPO

## 2019-04-10 VITALS — BP 137/76 | HR 77 | Temp 97.6°F | Resp 16 | Ht 70.0 in | Wt 182.0 lb

## 2019-04-10 DIAGNOSIS — Z5111 Encounter for antineoplastic chemotherapy: Secondary | ICD-10-CM | POA: Insufficient documentation

## 2019-04-10 DIAGNOSIS — C180 Malignant neoplasm of cecum: Secondary | ICD-10-CM

## 2019-04-10 MED ORDER — OXALIPLATIN CHEMO INJECTION 100 MG/20ML
123.0000 mg/m2 | Freq: Once | INTRAVENOUS | Status: AC
  Administered 2019-04-10: 250 mg via INTRAVENOUS
  Filled 2019-04-10: qty 40

## 2019-04-10 MED ORDER — DEXAMETHASONE SODIUM PHOSPHATE 10 MG/ML IJ SOLN
INTRAMUSCULAR | Status: AC
  Filled 2019-04-10: qty 1

## 2019-04-10 MED ORDER — PALONOSETRON HCL INJECTION 0.25 MG/5ML
0.2500 mg | Freq: Once | INTRAVENOUS | Status: AC
  Administered 2019-04-10: 0.25 mg via INTRAVENOUS

## 2019-04-10 MED ORDER — PROCHLORPERAZINE MALEATE 10 MG PO TABS: 10.0000 mg | ORAL_TABLET | Freq: Four times a day (QID) | ORAL | 1 refills | Status: DC | PRN

## 2019-04-10 MED ORDER — PALONOSETRON HCL INJECTION 0.25 MG/5ML
INTRAVENOUS | Status: AC
  Filled 2019-04-10: qty 5

## 2019-04-10 MED ORDER — DEXAMETHASONE SODIUM PHOSPHATE 10 MG/ML IJ SOLN
10.0000 mg | Freq: Once | INTRAMUSCULAR | Status: AC
  Administered 2019-04-10: 10 mg via INTRAVENOUS

## 2019-04-10 MED ORDER — ONDANSETRON HCL 8 MG PO TABS: 8.0000 mg | ORAL_TABLET | Freq: Three times a day (TID) | ORAL | 1 refills | Status: DC | PRN

## 2019-04-10 MED ORDER — DEXTROSE 5 % IV SOLN
Freq: Once | INTRAVENOUS | Status: AC
  Administered 2019-04-10: 14:00:00 via INTRAVENOUS
  Filled 2019-04-10: qty 250

## 2019-04-10 NOTE — Patient Instructions (Signed)
[facility] Discharge Instructions for Patients Receiving Chemotherapy  Today you received the following chemotherapy agents:  Oxaliplatin  To help prevent nausea and vomiting after your treatment, we encourage you to take your nausea medication as prescribed.   If you develop nausea and vomiting that is not controlled by your nausea medication, call the clinic.   BELOW ARE SYMPTOMS THAT SHOULD BE REPORTED IMMEDIATELY:  *FEVER GREATER THAN 100.5 F  *CHILLS WITH OR WITHOUT FEVER  NAUSEA AND VOMITING THAT IS NOT CONTROLLED WITH YOUR NAUSEA MEDICATION  *UNUSUAL SHORTNESS OF BREATH  *UNUSUAL BRUISING OR BLEEDING  TENDERNESS IN MOUTH AND THROAT WITH OR WITHOUT PRESENCE OF ULCERS  *URINARY PROBLEMS  *BOWEL PROBLEMS  UNUSUAL RASH Items with * indicate a potential emergency and should be followed up as soon as possible.  Feel free to call the clinic should you have any questions or concerns. The clinic phone number is [phone].  Please show the CHEMO ALERT CARD at check-in to the Emergency Department and triage nurse.   

## 2019-04-10 NOTE — Progress Notes (Signed)
  [place] OFFICE VISIT PROGRESS NOTE  I connected with@ on 04/10/19 at 10:45 AM EST by telephone and verified that I am speaking with the correct person using two identifiers.   I discussed the limitations, risks, security and privacy concerns of performing an evaluation and management service by telemedicine and the availability of in-person appointments. I also discussed with the patient that there may be a patient responsible charge related to this service. The patient expressed understanding and agreed to proceed.    Patient's location: Home Provider's location: Office   Diagnosis: Colon cancer  INTERVAL HISTORY:   Isaiah Gray is seen today for a telehealth visit per his request.  He is scheduled to begin Capox chemotherapy today.  He has taken the first dose of capecitabine.  He reports feeling well.  The surgical incisions are healed and his bowels are functioning.  He is eating. He decided against Port-A-Cath placement.    Lab Results:  Lab Results  Component Value Date   WBC 6.3 04/05/2019   HGB 12.8 (L) 04/05/2019   HCT 37.9 (L) 04/05/2019   MCV 90.5 04/05/2019   PLT 249 04/05/2019   NEUTROABS 4.2 04/05/2019     Medications: I have reviewed the patient's current medications.  Assessment/Plan: 1. Adenocarcinoma of the cecum, stage IIIb (T3N1B)   Right hemicolectomy 03/15/2019 moderate-poorly differentiated, lymphovascular invasion present, 2/17 lymph nodes positive, no loss of mismatch repair protein expression  Colonoscopy 01/25/2019-cecal mass, multiple polyps including the ascending, transverse, and sigmoid colon, pathology from the cecum-adenocarcinoma, pathology from the transverse and sigmoid polyps-tubular adenomas  Tubular adenoma at the proximal ascending colon on the right colectomy specimen  CTs 02/06/2019-mass at the medial wall of the cecum, 2 small ileocolic nodes measuring 5 mm, 4 mm irregular  subpleural nodule in the anterior right middle lobe-favored to be a benign subpleural lymph node  Elevated preoperative CEA, normal 04/05/2019  Cycle 1 CAPOX 04/10/2019 2. Gastroesophageal reflux 3. Hypertension 4.   Bilateral inguinal hernias on CT 02/06/19 5.   Family history of colon cancer 6.   Multiple tubular adenomas on the colonoscopy 01/25/2019 and a tubular adenoma on the right colon resection specimen    Disposition: Isaiah Gray was diagnosed with stage III colon cancer.  He is scheduled to begin adjuvant CAPOX today.  We reviewed potential toxicities associated with this chemotherapy regimen.  He has attended a chemotherapy teaching class.  The plan is to begin treatment today.  He will return for an office visit on 04/30/2019.  He will be scheduled for next treatment with oxaliplatin on 05/01/2019.   I discussed the assessment and treatment plan with the patient. The patient was provided an opportunity to ask questions and all were answered. The patient agreed with the plan and demonstrated an understanding of the instructions.   The patient was advised to call back or seek an in-person evaluation if the symptoms worsen or if the condition fails to improve as anticipated.   I provided 22 minutes of telephone, chart review, and documentation time during this encounter, and > 50% was spent counseling as documented under my assessment & plan.  Betsy Coder ANP/GNP-BC   04/10/2019 10:53 AM

## 2019-04-23 ENCOUNTER — Other Ambulatory Visit: Payer: Self-pay | Admitting: Oncology

## 2019-04-23 DIAGNOSIS — C180 Malignant neoplasm of cecum: Secondary | ICD-10-CM

## 2019-04-28 ENCOUNTER — Other Ambulatory Visit: Payer: Self-pay | Admitting: Oncology

## 2019-04-29 MED FILL — CAPECITABINE 500 MG TABS: 500 | 21 days supply | Qty: 98 | Fill #0

## 2019-04-30 ENCOUNTER — Inpatient Hospital Stay (HOSPITAL_BASED_OUTPATIENT_CLINIC_OR_DEPARTMENT_OTHER): Payer: BC Managed Care – PPO | Admitting: Oncology

## 2019-04-30 ENCOUNTER — Other Ambulatory Visit: Payer: Self-pay

## 2019-04-30 ENCOUNTER — Inpatient Hospital Stay: Payer: BC Managed Care – PPO

## 2019-04-30 VITALS — BP 150/84 | HR 86 | Temp 98.0°F | Resp 18 | Ht 70.0 in | Wt 183.4 lb

## 2019-04-30 DIAGNOSIS — C180 Malignant neoplasm of cecum: Secondary | ICD-10-CM

## 2019-04-30 LAB — CBC WITH DIFFERENTIAL (CANCER CENTER ONLY)
Abs Immature Granulocytes: 0.04 10*3/uL (ref 0.00–0.07)
Basophils Absolute: 0 10*3/uL (ref 0.0–0.1)
Basophils Relative: 1 %
Eosinophils Absolute: 0.2 10*3/uL (ref 0.0–0.5)
Eosinophils Relative: 4 %
HCT: 37.8 % — ABNORMAL LOW (ref 39.0–52.0)
Hemoglobin: 13 g/dL (ref 13.0–17.0)
Immature Granulocytes: 1 %
Lymphocytes Relative: 29 %
Lymphs Abs: 1.2 10*3/uL (ref 0.7–4.0)
MCH: 31.4 pg (ref 26.0–34.0)
MCHC: 34.4 g/dL (ref 30.0–36.0)
MCV: 91.3 fL (ref 80.0–100.0)
Monocytes Absolute: 0.6 10*3/uL (ref 0.1–1.0)
Monocytes Relative: 15 %
Neutro Abs: 2.1 10*3/uL (ref 1.7–7.7)
Neutrophils Relative %: 50 %
Platelet Count: 249 10*3/uL (ref 150–400)
RBC: 4.14 MIL/uL — ABNORMAL LOW (ref 4.22–5.81)
RDW: 14.7 % (ref 11.5–15.5)
WBC Count: 4.2 10*3/uL (ref 4.0–10.5)
nRBC: 0 % (ref 0.0–0.2)

## 2019-04-30 LAB — CMP (CANCER CENTER ONLY)
ALT: 33 U/L (ref 0–44)
AST: 36 U/L (ref 15–41)
Albumin: 3.6 g/dL (ref 3.5–5.0)
Alkaline Phosphatase: 83 U/L (ref 38–126)
Anion gap: 8 (ref 5–15)
BUN: 21 mg/dL (ref 8–23)
CO2: 26 mmol/L (ref 22–32)
Calcium: 8.6 mg/dL — ABNORMAL LOW (ref 8.9–10.3)
Chloride: 107 mmol/L (ref 98–111)
Creatinine: 1.13 mg/dL (ref 0.61–1.24)
GFR, Est AFR Am: >60 mL/min (ref >60)
GFR, Estimated: >60 mL/min (ref >60)
Glucose, Bld: 110 mg/dL — ABNORMAL HIGH (ref 70–99)
Potassium: 4.3 mmol/L (ref 3.5–5.1)
Sodium: 141 mmol/L (ref 135–145)
Total Bilirubin: 0.8 mg/dL (ref 0.3–1.2)
Total Protein: 5.8 g/dL — ABNORMAL LOW (ref 6.5–8.1)

## 2019-04-30 NOTE — Progress Notes (Signed)
  [place] OFFICE PROGRESS NOTE   Diagnosis: Colon cancer  INTERVAL HISTORY:   Isaiah Gray completed cycle 1 CAPOX beginning 04/10/2019.  He had mild nausea on day 4, relieved with ondansetron.  No mouth sores or hand/foot pain.  Cold sensitivity in the left hand near the IV site.  No numbness.  He reports constipation for several days following chemotherapy.  This was followed by intermittent diarrhea.  He has been working in his yard.  Objective:  Vital signs in last 24 hours:  Blood pressure (!) 150/84, pulse 86, temperature 98 F (36.7 C), temperature source Temporal, resp. rate 18, height _0  (1.778 m), weight 183 lb 6.4 oz (83.2 kg), SpO2 100 %.    HEENT: No thrush or ulcers GI: No hepatomegaly, healed surgical incisions, nontender Vascular: No leg edema, no evidence of phlebitis at the left hand IV site Skin: Palms and soles without erythema or skin breakdown.  Mild dryness at the soles.    Lab Results:  Lab Results  Component Value Date   WBC 4.2 04/30/2019   HGB 13.0 04/30/2019   HCT 37.8 (L) 04/30/2019   MCV 91.3 04/30/2019   PLT 249 04/30/2019   NEUTROABS 2.1 04/30/2019    CMP  Lab Results  Component Value Date   NA 140 04/05/2019   K 4.3 04/05/2019   CL 104 04/05/2019   CO2 25 04/05/2019   GLUCOSE 69 (L) 04/05/2019   BUN 23 04/05/2019   CREATININE 1.17 04/05/2019   CALCIUM 9.0 04/05/2019   PROT 6.2 (L) 04/05/2019   ALBUMIN 3.7 04/05/2019   AST 22 04/05/2019   ALT 23 04/05/2019   ALKPHOS 88 04/05/2019   BILITOT 0.7 04/05/2019   GFRNONAA >60 04/05/2019   GFRAA >60 04/05/2019    Lab Results  Component Value Date   CEA1 1.14 04/05/2019   Medications: I have reviewed the patient's current medications.   Assessment/Plan: 1. Adenocarcinoma of the cecum, stage IIIb (T3N1B)   Right hemicolectomy 03/15/2019 moderate-poorly differentiated, lymphovascular invasion present, 2/17 lymph nodes positive, no loss of mismatch repair  protein expression  Colonoscopy 01/25/2019-cecal mass, multiple polyps including the ascending, transverse, and sigmoid colon, pathology from the cecum-adenocarcinoma, pathology from the transverse and sigmoid polyps-tubular adenomas  Tubular adenoma at the proximal ascending colon on the right colectomy specimen  CTs 02/06/2019-mass at the medial wall of the cecum, 2 small ileocolic nodes measuring 5 mm, 4 mm irregular subpleural nodule in the anterior right middle lobe-favored to be a benign subpleural lymph node  Elevated preoperative CEA, normal 04/05/2019  Cycle 1 CAPOX 04/10/2019  Cycle 2 CAPOX 05/01/2019 2. Gastroesophageal reflux 3. Hypertension 4.   Bilateral inguinal hernias on CT 02/06/19 5.   Family history of colon cancer 6.   Multiple tubular adenomas on the colonoscopy 01/25/2019 and a tubular adenoma on the right colon resection specimen     Disposition: Isaiah Gray appears well.  He tolerated the first cycle of chemotherapy without significant toxicity.  He will complete cycle 2 CAPOX beginning tomorrow.  He will return for an office and lab visit in 3 weeks.  He will call for increased diarrhea following this cycle of chemotherapy.  Betsy Coder, MD  04/30/2019  8:50 AM

## 2019-05-01 ENCOUNTER — Telehealth: Payer: Self-pay | Admitting: Oncology

## 2019-05-01 ENCOUNTER — Inpatient Hospital Stay: Payer: BC Managed Care – PPO

## 2019-05-01 ENCOUNTER — Other Ambulatory Visit: Payer: Self-pay

## 2019-05-01 VITALS — BP 143/95 | HR 80 | Temp 98.0°F | Resp 16

## 2019-05-01 DIAGNOSIS — C180 Malignant neoplasm of cecum: Secondary | ICD-10-CM

## 2019-05-01 MED ORDER — DEXAMETHASONE SODIUM PHOSPHATE 10 MG/ML IJ SOLN
10.0000 mg | Freq: Once | INTRAMUSCULAR | Status: AC
  Administered 2019-05-01: 10 mg via INTRAVENOUS

## 2019-05-01 MED ORDER — PALONOSETRON HCL INJECTION 0.25 MG/5ML
0.2500 mg | Freq: Once | INTRAVENOUS | Status: AC
  Administered 2019-05-01: 0.25 mg via INTRAVENOUS

## 2019-05-01 MED ORDER — DEXTROSE 5 % IV SOLN
Freq: Once | INTRAVENOUS | Status: AC
  Filled 2019-05-01: qty 250

## 2019-05-01 MED ORDER — OXALIPLATIN CHEMO INJECTION 100 MG/20ML
123.0000 mg/m2 | Freq: Once | INTRAVENOUS | Status: AC
  Administered 2019-05-01: 250 mg via INTRAVENOUS
  Filled 2019-05-01: qty 40

## 2019-05-01 NOTE — Telephone Encounter (Signed)
Scheduled per los. Patient will be given printout at todays appt

## 2019-05-01 NOTE — Patient Instructions (Signed)
[facility] Discharge Instructions for Patients Receiving Chemotherapy  Today you received the following chemotherapy agents:  Oxaliplatin  To help prevent nausea and vomiting after your treatment, we encourage you to take your nausea medication as prescribed.   If you develop nausea and vomiting that is not controlled by your nausea medication, call the clinic.   BELOW ARE SYMPTOMS THAT SHOULD BE REPORTED IMMEDIATELY:  *FEVER GREATER THAN 100.5 F  *CHILLS WITH OR WITHOUT FEVER  NAUSEA AND VOMITING THAT IS NOT CONTROLLED WITH YOUR NAUSEA MEDICATION  *UNUSUAL SHORTNESS OF BREATH  *UNUSUAL BRUISING OR BLEEDING  TENDERNESS IN MOUTH AND THROAT WITH OR WITHOUT PRESENCE OF ULCERS  *URINARY PROBLEMS  *BOWEL PROBLEMS  UNUSUAL RASH Items with * indicate a potential emergency and should be followed up as soon as possible.  Feel free to call the clinic should you have any questions or concerns. The clinic phone number is [phone].  Please show the CHEMO ALERT CARD at check-in to the Emergency Department and triage nurse.   

## 2019-05-02 MED ORDER — DEXAMETHASONE SODIUM PHOSPHATE 10 MG/ML IJ SOLN
INTRAMUSCULAR | Status: AC
  Filled 2019-05-02: qty 1

## 2019-05-10 DIAGNOSIS — U071 COVID-19: Secondary | ICD-10-CM

## 2019-05-10 DIAGNOSIS — J1282 Pneumonia due to coronavirus disease 2019: Secondary | ICD-10-CM

## 2019-05-10 HISTORY — DX: COVID-19: U07.1

## 2019-05-10 HISTORY — DX: Pneumonia due to coronavirus disease 2019: J12.82

## 2019-05-14 ENCOUNTER — Telehealth: Payer: Self-pay | Admitting: *Deleted

## 2019-05-14 ENCOUNTER — Other Ambulatory Visit: Payer: Self-pay | Admitting: Oncology

## 2019-05-14 DIAGNOSIS — C180 Malignant neoplasm of cecum: Secondary | ICD-10-CM

## 2019-05-14 NOTE — Telephone Encounter (Signed)
Notified patient that Dr. Benay Spice said to try OTC Tylenol or Ibuprofen for now. Will discuss in more detail at next appointment. May need to dose reduce his chemo.

## 2019-05-14 NOTE — Telephone Encounter (Signed)
Reports he has developed discomfort in his feet--tingling sensation and painful to walk/stand. Bottom of feet are red, but no blisters or peeling. (cycle #2 CAPEOX 12/23). Is using Utter Cream to feet. Asking if MD can order something for the pain?

## 2019-05-19 ENCOUNTER — Other Ambulatory Visit: Payer: Self-pay | Admitting: Oncology

## 2019-05-20 MED FILL — CAPECITABINE 500 MG TABS: 500 | 21 days supply | Qty: 98 | Fill #0

## 2019-05-21 ENCOUNTER — Inpatient Hospital Stay: Payer: BC Managed Care – PPO | Attending: Oncology | Admitting: Oncology

## 2019-05-21 ENCOUNTER — Inpatient Hospital Stay: Payer: BC Managed Care – PPO

## 2019-05-21 ENCOUNTER — Encounter: Payer: Self-pay | Admitting: Oncology

## 2019-05-21 ENCOUNTER — Other Ambulatory Visit: Payer: Self-pay

## 2019-05-21 VITALS — BP 137/73 | HR 84 | Temp 98.5°F | Resp 18 | Ht 70.0 in | Wt 181.5 lb

## 2019-05-21 DIAGNOSIS — Z5111 Encounter for antineoplastic chemotherapy: Secondary | ICD-10-CM | POA: Diagnosis present

## 2019-05-21 DIAGNOSIS — C180 Malignant neoplasm of cecum: Secondary | ICD-10-CM | POA: Diagnosis present

## 2019-05-21 DIAGNOSIS — R197 Diarrhea, unspecified: Secondary | ICD-10-CM | POA: Insufficient documentation

## 2019-05-21 LAB — CBC WITH DIFFERENTIAL (CANCER CENTER ONLY)
Abs Immature Granulocytes: 0.03 10*3/uL (ref 0.00–0.07)
Basophils Absolute: 0 10*3/uL (ref 0.0–0.1)
Basophils Relative: 0 %
Eosinophils Absolute: 0.1 10*3/uL (ref 0.0–0.5)
Eosinophils Relative: 1 %
HCT: 37.3 % — ABNORMAL LOW (ref 39.0–52.0)
Hemoglobin: 12.7 g/dL — ABNORMAL LOW (ref 13.0–17.0)
Immature Granulocytes: 1 %
Lymphocytes Relative: 23 %
Lymphs Abs: 1 10*3/uL (ref 0.7–4.0)
MCH: 31.5 pg (ref 26.0–34.0)
MCHC: 34 g/dL (ref 30.0–36.0)
MCV: 92.6 fL (ref 80.0–100.0)
Monocytes Absolute: 1.2 10*3/uL — ABNORMAL HIGH (ref 0.1–1.0)
Monocytes Relative: 29 %
Neutro Abs: 1.9 10*3/uL (ref 1.7–7.7)
Neutrophils Relative %: 46 %
Platelet Count: 172 10*3/uL (ref 150–400)
RBC: 4.03 MIL/uL — ABNORMAL LOW (ref 4.22–5.81)
RDW: 18.2 % — ABNORMAL HIGH (ref 11.5–15.5)
WBC Count: 4.1 10*3/uL (ref 4.0–10.5)
nRBC: 0 % (ref 0.0–0.2)

## 2019-05-21 LAB — CMP (CANCER CENTER ONLY)
ALT: 97 U/L — ABNORMAL HIGH (ref 0–44)
AST: 74 U/L — ABNORMAL HIGH (ref 15–41)
Albumin: 3.7 g/dL (ref 3.5–5.0)
Alkaline Phosphatase: 106 U/L (ref 38–126)
Anion gap: 10 (ref 5–15)
BUN: 21 mg/dL (ref 8–23)
CO2: 24 mmol/L (ref 22–32)
Calcium: 8.5 mg/dL — ABNORMAL LOW (ref 8.9–10.3)
Chloride: 108 mmol/L (ref 98–111)
Creatinine: 1.28 mg/dL — ABNORMAL HIGH (ref 0.61–1.24)
GFR, Est AFR Am: >60 mL/min (ref >60)
GFR, Estimated: 60 mL/min — ABNORMAL LOW (ref >60)
Glucose, Bld: 95 mg/dL (ref 70–99)
Potassium: 4.3 mmol/L (ref 3.5–5.1)
Sodium: 142 mmol/L (ref 135–145)
Total Bilirubin: 0.7 mg/dL (ref 0.3–1.2)
Total Protein: 6.2 g/dL — ABNORMAL LOW (ref 6.5–8.1)

## 2019-05-21 NOTE — Progress Notes (Signed)
[place] OFFICE PROGRESS NOTE   Diagnosis: Colon cancer  INTERVAL HISTORY:   Isaiah Gray completed cycle 2 CAPOX beginning 05/01/2019.  He reports less discomfort with the oxaliplatin infusion when the IV was higher in the arm.  He had cold sensitivity over the 2 weeks following chemotherapy.  This has resolved.  No peripheral numbness. He developed discomfort at the soles of the feet during the last 2 days of capecitabine.  Tylenol helped.  This has resolved.  He had difficulty walking when he had the foot pain.  He has one sore at the left buccal mucosa after biting his cheek.  He has diarrhea.  This has persisted.  He took Imodium today.  He had 3-4 small volume loose stool yesterday.  Mr. Downard continues working.  Objective:  Vital signs in last 24 hours:  Blood pressure 137/73, pulse 84, temperature 98.5 F (36.9 C), temperature source Temporal, resp. rate 18, height '5\' 10"'$  (1.778 m), weight 181 lb 8 oz (82.3 kg), SpO2 99 %.    HEENT: 2 mm ulcer at the left buccal mucosa, no other ulcers, no thrush  Resp: Lungs clear bilaterally Cardio: Regular rate and rhythm GI: Nontender, no hepatomegaly Vascular: No leg edema  Skin: Palms without erythema, skin thickening with callus formation and mild erythema at the soles, no skin breakdown  Portacath/PICC-without erythema  Lab Results:  Lab Results  Component Value Date   WBC 4.1 05/21/2019   HGB 12.7 (L) 05/21/2019   HCT 37.3 (L) 05/21/2019   MCV 92.6 05/21/2019   PLT 172 05/21/2019   NEUTROABS 1.9 05/21/2019    CMP  Lab Results  Component Value Date   NA 141 04/30/2019   K 4.3 04/30/2019   CL 107 04/30/2019   CO2 26 04/30/2019   GLUCOSE 110 (H) 04/30/2019   BUN 21 04/30/2019   CREATININE 1.13 04/30/2019   CALCIUM 8.6 (L) 04/30/2019   PROT 5.8 (L) 04/30/2019   ALBUMIN 3.6 04/30/2019   AST 36 04/30/2019   ALT 33 04/30/2019   ALKPHOS 83 04/30/2019   BILITOT 0.8 04/30/2019   GFRNONAA >60  04/30/2019   GFRAA >60 04/30/2019    Lab Results  Component Value Date   CEA1 1.14 04/05/2019     Medications: I have reviewed the patient's current medications.   Assessment/Plan: 1. Adenocarcinoma of the cecum, stage IIIb (T3N1B)   Right hemicolectomy 03/15/2019 moderate-poorly differentiated, lymphovascular invasion present, 2/17 lymph nodes positive, no loss of mismatch repair protein expression  Colonoscopy 01/25/2019-cecal mass, multiple polyps including the ascending, transverse, and sigmoid colon, pathology from the cecum-adenocarcinoma, pathology from the transverse and sigmoid polyps-tubular adenomas  Tubular adenoma at the proximal ascending colon on the right colectomy specimen  CTs 02/06/2019-mass at the medial wall of the cecum, 2 small ileocolic nodes measuring 5 mm, 4 mm irregular subpleural nodule in the anterior right middle lobe-favored to be a benign subpleural lymph node  Elevated preoperative CEA, normal 04/05/2019  Cycle 1 CAPOX 04/10/2019  Cycle 2 CAPOX 05/01/2019  Cycle 3 CAPOX 05/22/2019, Xeloda dose reduced secondary to hand/foot syndrome and diarrhea 2. Gastroesophageal reflux 3. Hypertension 4.   Bilateral inguinal hernias on CT 02/06/19 5.   Family history of colon cancer 6.   Multiple tubular adenomas on the colonoscopy 01/25/2019 and a tubular adenoma on the right colon resection specimen 7.   Elevated liver enzymes-likely secondary to oxaliplatin   Disposition: Mr. Dulude has completed 2 cycles of CAPOX.  He has tolerated chemotherapy well,  though he developed increased diarrhea and hand/foot syndrome following cycle 2.  He will begin cycle 3 CAPOX tomorrow.  The capecitabine will be dose reduced.  He will call for increased diarrhea or progressive hand/foot symptoms.  He will return for an office and lab visit in 3 weeks.  Betsy Coder, MD  05/21/2019  11:11 AM

## 2019-05-21 NOTE — Progress Notes (Signed)
Met with patient at registration to introduce myself as Arboriculturist and to offer available resources.  Discussed one-time $66 Engineer, drilling to assist with personal expenses while going through treatment. Patient states he is over the income.  Gave him my card for any other financial questions or concerns.

## 2019-05-22 ENCOUNTER — Other Ambulatory Visit: Payer: Self-pay

## 2019-05-22 ENCOUNTER — Inpatient Hospital Stay: Payer: BC Managed Care – PPO

## 2019-05-22 ENCOUNTER — Ambulatory Visit: Payer: BC Managed Care – PPO

## 2019-05-22 VITALS — BP 142/73 | HR 90 | Temp 98.3°F | Resp 18

## 2019-05-22 DIAGNOSIS — C180 Malignant neoplasm of cecum: Secondary | ICD-10-CM | POA: Diagnosis not present

## 2019-05-22 MED ORDER — DEXTROSE 5 % IV SOLN
Freq: Once | INTRAVENOUS | Status: AC
  Filled 2019-05-22: qty 250

## 2019-05-22 MED ORDER — OXALIPLATIN CHEMO INJECTION 100 MG/20ML
123.0000 mg/m2 | Freq: Once | INTRAVENOUS | Status: AC
  Administered 2019-05-22: 250 mg via INTRAVENOUS
  Filled 2019-05-22: qty 40

## 2019-05-22 MED ORDER — DEXAMETHASONE SODIUM PHOSPHATE 10 MG/ML IJ SOLN
10.0000 mg | Freq: Once | INTRAMUSCULAR | Status: AC
  Administered 2019-05-22: 10 mg via INTRAVENOUS

## 2019-05-22 MED ORDER — PALONOSETRON HCL INJECTION 0.25 MG/5ML
0.2500 mg | Freq: Once | INTRAVENOUS | Status: AC
  Administered 2019-05-22: 0.25 mg via INTRAVENOUS

## 2019-05-22 MED ORDER — DEXAMETHASONE SODIUM PHOSPHATE 10 MG/ML IJ SOLN
INTRAMUSCULAR | Status: AC
  Filled 2019-05-22: qty 1

## 2019-05-22 MED ORDER — PALONOSETRON HCL INJECTION 0.25 MG/5ML
INTRAVENOUS | Status: AC
  Filled 2019-05-22: qty 5

## 2019-05-22 NOTE — Progress Notes (Signed)
Per Dr. Benay Spice: OK to treat with ALT 97.

## 2019-05-22 NOTE — Patient Instructions (Signed)
[facility] Discharge Instructions for Patients Receiving Chemotherapy  Today you received the following chemotherapy agents:  Oxaliplatin  To help prevent nausea and vomiting after your treatment, we encourage you to take your nausea medication as prescribed.   If you develop nausea and vomiting that is not controlled by your nausea medication, call the clinic.   BELOW ARE SYMPTOMS THAT SHOULD BE REPORTED IMMEDIATELY:  *FEVER GREATER THAN 100.5 F  *CHILLS WITH OR WITHOUT FEVER  NAUSEA AND VOMITING THAT IS NOT CONTROLLED WITH YOUR NAUSEA MEDICATION  *UNUSUAL SHORTNESS OF BREATH  *UNUSUAL BRUISING OR BLEEDING  TENDERNESS IN MOUTH AND THROAT WITH OR WITHOUT PRESENCE OF ULCERS  *URINARY PROBLEMS  *BOWEL PROBLEMS  UNUSUAL RASH Items with * indicate a potential emergency and should be followed up as soon as possible.  Feel free to call the clinic should you have any questions or concerns. The clinic phone number is [phone].  Please show the CHEMO ALERT CARD at check-in to the Emergency Department and triage nurse.   

## 2019-05-27 ENCOUNTER — Telehealth: Payer: Self-pay | Admitting: *Deleted

## 2019-05-27 NOTE — Telephone Encounter (Addendum)
Had called answering service yesterday reporting N/V and diarrhea. Last CAPEOX 05/22/19. Called him and left VM asking for return call to assess for need to be seen or receive IV fluids. Patient returned call: Reports he had had dry heaves--never vomited. His symptoms have new settled down and he has been able to drink liquids and eat mashed potatoes. Last loose stool was last night prior to Imodium. No BM today. Still has some pain in his feet, but it is manageable. Reports he can have some nausea after taking the xeloda. Suggested trying to take zofran 1 hour prior--eat small meal and then take xeloda.

## 2019-05-29 ENCOUNTER — Telehealth: Payer: Self-pay | Admitting: *Deleted

## 2019-05-29 DIAGNOSIS — C180 Malignant neoplasm of cecum: Secondary | ICD-10-CM

## 2019-05-29 NOTE — Telephone Encounter (Signed)
Reports he continues to have diarrhea ~ 2 times/day and taking Imodium 2-4/day. Has abdominal discomfort and nausea and can't eat anything for past few days. Has also stopped his capecitabine for now. No fever or shortness of breath. Has mild cough, but only when he is sitting on the toilet. Asking to be seen. Scheduled for lab/OV tomorrow at 1130.

## 2019-05-30 ENCOUNTER — Inpatient Hospital Stay: Payer: BC Managed Care – PPO

## 2019-05-30 ENCOUNTER — Other Ambulatory Visit: Payer: Self-pay

## 2019-05-30 ENCOUNTER — Emergency Department (HOSPITAL_COMMUNITY)
Admission: EM | Admit: 2019-05-30 | Discharge: 2019-05-30 | Disposition: A | Discharge status: Home / Self Care | Payer: BC Managed Care – PPO | Attending: Emergency Medicine | Admitting: Emergency Medicine

## 2019-05-30 ENCOUNTER — Ambulatory Visit: Payer: BC Managed Care – PPO

## 2019-05-30 ENCOUNTER — Emergency Department (HOSPITAL_COMMUNITY): Payer: BC Managed Care – PPO

## 2019-05-30 ENCOUNTER — Inpatient Hospital Stay (HOSPITAL_BASED_OUTPATIENT_CLINIC_OR_DEPARTMENT_OTHER): Payer: BC Managed Care – PPO | Admitting: Oncology

## 2019-05-30 ENCOUNTER — Encounter (HOSPITAL_COMMUNITY): Payer: Self-pay | Admitting: *Deleted

## 2019-05-30 VITALS — BP 130/74 | HR 99 | Temp 98.0°F | Resp 16 | Ht 70.0 in | Wt 166.3 lb

## 2019-05-30 DIAGNOSIS — I10 Essential (primary) hypertension: Secondary | ICD-10-CM | POA: Insufficient documentation

## 2019-05-30 DIAGNOSIS — C180 Malignant neoplasm of cecum: Secondary | ICD-10-CM

## 2019-05-30 DIAGNOSIS — Z7982 Long term (current) use of aspirin: Secondary | ICD-10-CM | POA: Insufficient documentation

## 2019-05-30 DIAGNOSIS — U071 COVID-19: Secondary | ICD-10-CM | POA: Insufficient documentation

## 2019-05-30 DIAGNOSIS — R197 Diarrhea, unspecified: Secondary | ICD-10-CM

## 2019-05-30 DIAGNOSIS — E860 Dehydration: Secondary | ICD-10-CM

## 2019-05-30 DIAGNOSIS — Z79899 Other long term (current) drug therapy: Secondary | ICD-10-CM | POA: Diagnosis not present

## 2019-05-30 LAB — CMP (CANCER CENTER ONLY)
ALT: 43 U/L (ref 0–44)
AST: 48 U/L — ABNORMAL HIGH (ref 15–41)
Albumin: 3.2 g/dL — ABNORMAL LOW (ref 3.5–5.0)
Alkaline Phosphatase: 115 U/L (ref 38–126)
Anion gap: 12 (ref 5–15)
BUN: 20 mg/dL (ref 8–23)
CO2: 24 mmol/L (ref 22–32)
Calcium: 9.3 mg/dL (ref 8.9–10.3)
Chloride: 104 mmol/L (ref 98–111)
Creatinine: 1.29 mg/dL — ABNORMAL HIGH (ref 0.61–1.24)
GFR, Est AFR Am: >60 mL/min (ref >60)
GFR, Estimated: 59 mL/min — ABNORMAL LOW (ref >60)
Glucose, Bld: 115 mg/dL — ABNORMAL HIGH (ref 70–99)
Potassium: 4.1 mmol/L (ref 3.5–5.1)
Sodium: 140 mmol/L (ref 135–145)
Total Bilirubin: 0.7 mg/dL (ref 0.3–1.2)
Total Protein: 6.7 g/dL (ref 6.5–8.1)

## 2019-05-30 LAB — CBC WITH DIFFERENTIAL (CANCER CENTER ONLY)
Abs Immature Granulocytes: 0.11 10*3/uL — ABNORMAL HIGH (ref 0.00–0.07)
Basophils Absolute: 0 10*3/uL (ref 0.0–0.1)
Basophils Relative: 0 %
Eosinophils Absolute: 0 10*3/uL (ref 0.0–0.5)
Eosinophils Relative: 0 %
HCT: 40.1 % (ref 39.0–52.0)
Hemoglobin: 14 g/dL (ref 13.0–17.0)
Immature Granulocytes: 2 %
Lymphocytes Relative: 13 %
Lymphs Abs: 0.9 10*3/uL (ref 0.7–4.0)
MCH: 31.1 pg (ref 26.0–34.0)
MCHC: 34.9 g/dL (ref 30.0–36.0)
MCV: 89.1 fL (ref 80.0–100.0)
Monocytes Absolute: 1.2 10*3/uL — ABNORMAL HIGH (ref 0.1–1.0)
Monocytes Relative: 18 %
Neutro Abs: 4.7 10*3/uL (ref 1.7–7.7)
Neutrophils Relative %: 67 %
Platelet Count: 180 10*3/uL (ref 150–400)
RBC: 4.5 MIL/uL (ref 4.22–5.81)
RDW: 17.4 % — ABNORMAL HIGH (ref 11.5–15.5)
WBC Count: 6.9 10*3/uL (ref 4.0–10.5)
nRBC: 0 % (ref 0.0–0.2)

## 2019-05-30 LAB — SARS CORONAVIRUS 2 (TAT 6-24 HRS): SARS Coronavirus 2: POSITIVE — AB

## 2019-05-30 LAB — POC SARS CORONAVIRUS 2 AG -  ED: SARS Coronavirus 2 Ag: NEGATIVE

## 2019-05-30 MED ORDER — SODIUM CHLORIDE 0.9 % IV BOLUS
1000.0000 mL | Freq: Once | INTRAVENOUS | Status: AC
  Administered 2019-05-30: 1000 mL via INTRAVENOUS

## 2019-05-30 MED ORDER — ONDANSETRON HCL 4 MG/2ML IJ SOLN
4.0000 mg | Freq: Once | INTRAMUSCULAR | Status: AC
  Administered 2019-05-30: 4 mg via INTRAVENOUS
  Filled 2019-05-30: qty 2

## 2019-05-30 NOTE — Progress Notes (Signed)
[place] OFFICE PROGRESS NOTE   Diagnosis: Colon cancer  INTERVAL HISTORY:   Mr. Isaiah Gray returns prior to a scheduled visit.  He began cycle 3 CAPOX on 05/22/2019.  He reports increased diarrhea.  Diarrhea has persisted despite Imodium.  He has nausea.  He discontinued capecitabine on 05/28/2019.  He reports 2 loose stools today.  His oral intake has been limited.  Mr. Geesey has a cough.  No fever.  A coworker was recently diagnosed with COVID-19.  He reports last being exposed to the coworker approximately 10-12 days ago.  His wife has a "cold "and had a Covid test yesterday.  Objective:  Vital signs in last 24 hours:  Blood pressure 130/74, pulse 99, temperature 98 F (36.7 C), temperature source Temporal, resp. rate 16, height 5' 10"  (1.778 m), weight 166 lb 4.8 oz (75.4 kg), SpO2 97 %.    HEENT: The mucous membranes are moist, no thrush or ulcers Resp: End inspiratory rales at the lower greater than upper posterior chest bilaterally, no respiratory distress Cardio: Regular rate and rhythm GI: Soft and nontender Vascular: No leg edema  Skin: Mild skin thickening and erythema at the fingers, dryness and skin thickening at the soles   Lab Results:  Lab Results  Component Value Date   WBC 6.9 05/30/2019   HGB 14.0 05/30/2019   HCT 40.1 05/30/2019   MCV 89.1 05/30/2019   PLT 180 05/30/2019   NEUTROABS 4.7 05/30/2019    CMP  Lab Results  Component Value Date   NA 140 05/30/2019   K 4.1 05/30/2019   CL 104 05/30/2019   CO2 24 05/30/2019   GLUCOSE 115 (H) 05/30/2019   BUN 20 05/30/2019   CREATININE 1.29 (H) 05/30/2019   CALCIUM 9.3 05/30/2019   PROT 6.7 05/30/2019   ALBUMIN 3.2 (L) 05/30/2019   AST 48 (H) 05/30/2019   ALT 43 05/30/2019   ALKPHOS 115 05/30/2019   BILITOT 0.7 05/30/2019   GFRNONAA 59 (L) 05/30/2019   GFRAA >60 05/30/2019    Lab Results  Component Value Date   CEA1 1.14 04/05/2019     Medications: I have reviewed the  patient's current medications.   Assessment/Plan:  1. Adenocarcinoma of the cecum, stage IIIb (T3N1B)   Right hemicolectomy 03/15/2019 moderate-poorly differentiated, lymphovascular invasion present, 2/17 lymph nodes positive, no loss of mismatch repair protein expression  Colonoscopy 01/25/2019-cecal mass, multiple polyps including the ascending, transverse, and sigmoid colon, pathology from the cecum-adenocarcinoma, pathology from the transverse and sigmoid polyps-tubular adenomas  Tubular adenoma at the proximal ascending colon on the right colectomy specimen  CTs 02/06/2019-mass at the medial wall of the cecum, 2 small ileocolic nodes measuring 5 mm, 4 mm irregular subpleural nodule in the anterior right middle lobe-favored to be a benign subpleural lymph node  Elevated preoperative CEA, normal 04/05/2019  Cycle 1 CAPOX 04/10/2019  Cycle 2 CAPOX 05/01/2019  Cycle 3 CAPOX 05/22/2019, Xeloda dose reduced secondary to hand/foot syndrome and diarrhea 2. Gastroesophageal reflux 3. Hypertension 4.   Bilateral inguinal hernias on CT 02/06/19 5.   Family history of colon cancer 6.   Multiple tubular adenomas on the colonoscopy 01/25/2019 and a tubular adenoma on the right colon resection specimen 7.   Elevated liver enzymes-likely secondary to oxaliplatin 8.  Diarrhea, cough, dehydration 05/30/2019-referred to the emergency room for hospital admission and COVID-19 testing    Disposition: Mr. Dudzinski is now at day 9 following cycle 3 CAPOX.  He last took capecitabine on 05/28/2019.  He presents today with diarrhea, cough, and nausea.  He has lost a significant amount of weight over the past week.  His symptoms could be related to toxicity from oxaliplatin and capecitabine.  I am also concerned he could have COVID-19 infection.  He appears dehydrated.  Mr. Langhorst will be referred to the emergency room for additional diagnostic evaluation and intravenous hydration.  He will most likely need  to be admitted for IV fluids.  Outpatient follow-up will be arranged at the Cancer center pending results of the emergency room evaluation.  Capecitabine will remain on hold.  Betsy Coder, MD  05/30/2019  1:59 PM

## 2019-05-30 NOTE — ED Notes (Signed)
Pt verbalizes understanding of DC instructions. Pt belongings returned and is ambulatory out of ED.  

## 2019-05-30 NOTE — ED Triage Notes (Signed)
Pt from cancer center, last Chemo 1/13, today along with diarrhea that is common with Chemo, stated by nurse he has reported a cough and shob. States these symptoms have been present about 10 days. He is more focused on the diarrhea during triage. No noted SHOB or cough during this time.

## 2019-05-30 NOTE — Discharge Instructions (Addendum)
Recommend taking your previously prescribed nausea medicine as needed.  Return to ER if you develop abdominal pain, vomiting, other new concerning symptom.  Please follow-up with your primary doctor or oncologist and have your blood work rechecked within the next week.    Future Appointments  Date Time Provider [place]  05/31/2019 11:30 AM [facility] INFUSION [facility] None  06/01/2019  9:00 AM [facility] INFUSION [facility] None  06/11/2019  9:00 AM [facility] LAB/FLUSH [facility] None  06/11/2019  9:30 AM Ladell Pier, MD [facility] None  06/12/2019  8:45 AM [facility] INFUSION [facility] None

## 2019-05-30 NOTE — ED Provider Notes (Signed)
[facility] DEPT Provider Note   CSN: [id number] Arrival date & time: 05/30/19  1339     History Chief Complaint  Patient presents with  . Shortness of Breath  . Cough  . Diarrhea    Isaiah Gray is a 64 y.o. male.  Past medical history colon cancer, on cycle 3 of CAPOX.  Presented to his oncology office with complaints of dehydration, fatigue, loose stools.  He reports his symptoms have been going on for the past few weeks, today had 2 loose stools.  He has had very poor appetite but has been able to keep down some liquids.  Feels anything solid goes straight through his system.  Has had mild cough, nonproductive.  No difficulty in breathing, no chest pain.  No fever.  Went to oncology office this afternoon, sent to ER to get Covid test and chest x-ray.  HPI     Past Medical History:  Diagnosis Date  . Cataract 2015   right ecl with lens inplant  . GERD (gastroesophageal reflux disease)   . HTN (hypertension)   . Primary cancer of cecum ([place]) dx'd 01/25/19    Patient Active Problem List   Diagnosis Date Noted  . GERD (gastroesophageal reflux disease)   . Primary cancer of cecum s/p lap right proximal colectomy 03/15/2019   . S/P right hemicolectomy 03/15/2019  . Benign essential hypertension 06/22/2015  . Chronic dryness of both eyes 06/22/2015  . Cortical age-related cataract of left eye 06/22/2015  . ED (erectile dysfunction) 06/22/2015  . Impaired fasting glucose 06/22/2015  . Pigmentary glaucoma of both eyes, indeterminate stage 06/22/2015  . Pseudophakia, right eye 06/22/2015  . Cataract 2015    Past Surgical History:  Procedure Laterality Date  . ENDOSCOPIC VEIN LASER TREATMENT Bilateral 2020   legs   . INGUINAL HERNIA REPAIR Right    age 3  . LAPAROSCOPIC PARTIAL COLECTOMY Right 03/15/2019   Procedure: LAPAROSCOPIC RIGHT HEMICOLECTOMY;  Surgeon: Ileana Roup, MD;  Location: [facility];  Service: General;  Laterality:  Right;  . MENISCUS REPAIR Bilateral   . VARICOSE VEIN SURGERY Right        Family History  Problem Relation Age of Onset  . Hypertension Mother   . Hypertension Father   . Colon cancer Maternal Grandmother 86  . Cancer Paternal Grandmother        ? intestinal    Social History   Tobacco Use  . Smoking status: Never Smoker  . Smokeless tobacco: Never Used  Substance Use Topics  . Alcohol use: Yes    Comment: weekends  . Drug use: Yes    Types: Marijuana    Comment: 03-12-2019 , last use x 1 month ago     Home Medications Prior to Admission medications   Medication Sig Start Date End Date Taking? Authorizing Provider  aspirin EC 81 MG tablet Take 81 mg by mouth daily.     [provider]  capecitabine (XELODA) 500 MG tablet TAKE 4 TABLETS (2000MG ) IN AM AND 3 TABLETS (1500MG ) IN PM, IMMEDIATELY AFTER MEALS. TAKE FOR 14 DAYS ON, 7 DAYS OFF, REPEAT. TAKE BY MOUTH. 05/22/19   Ladell Pier, MD  losartan (COZAAR) 50 MG tablet Take 50 mg by mouth daily. 12/03/18   [provider]  omeprazole (PRILOSEC OTC) 20 MG tablet Take 1 tablet (20 mg total) by mouth 2 (two) times daily. 01/10/19   Armbruster, Carlota Raspberry, MD  ondansetron (ZOFRAN) 8 MG tablet Take 1  tablet (8 mg total) by mouth every 8 (eight) hours as needed for nausea or vomiting. Start 72 hours after IV chemo treatment 04/10/19   Ladell Pier, MD  prochlorperazine (COMPAZINE) 10 MG tablet Take 1 tablet (10 mg total) by mouth every 6 (six) hours as needed. 04/10/19   Ladell Pier, MD  Red Yeast Rice Extract (RED YEAST RICE PO) Take 2 capsules by mouth 2 (two) times daily.    [provider]  sildenafil (REVATIO) 20 MG tablet Take 20 mg by mouth daily as needed (ED).    [provider]  sucralfate (CARAFATE) 1 GM/10ML suspension Take 10 mLs (1 g total) by mouth every 6 (six) hours as needed. Patient not taking: Reported on 01/25/2019 01/10/19   Armbruster, Carlota Raspberry, MD    Allergies      Patient has no known allergies.  Review of Systems   Review of Systems  Constitutional: Positive for fatigue. Negative for chills and fever.  HENT: Negative for ear pain and sore throat.   Eyes: Negative for pain and visual disturbance.  Respiratory: Positive for cough. Negative for shortness of breath.   Cardiovascular: Negative for chest pain and palpitations.  Gastrointestinal: Positive for diarrhea. Negative for abdominal pain and vomiting.  Genitourinary: Negative for dysuria and hematuria.  Musculoskeletal: Negative for arthralgias and back pain.  Skin: Negative for color change and rash.  Neurological: Negative for seizures and syncope.  All other systems reviewed and are negative.   Physical Exam Updated Vital Signs BP (!) 159/87   Pulse 83   Temp 98.2 F (36.8 C) (Oral)   Resp 20   SpO2 96%   Physical Exam Vitals and nursing note reviewed.  Constitutional:      Appearance: He is well-developed.  HENT:     Head: Normocephalic and atraumatic.  Eyes:     Conjunctiva/sclera: Conjunctivae normal.  Cardiovascular:     Rate and Rhythm: Normal rate and regular rhythm.     Heart sounds: No murmur.  Pulmonary:     Effort: Pulmonary effort is normal. No respiratory distress.     Breath sounds: Normal breath sounds.  Abdominal:     Palpations: Abdomen is soft.     Tenderness: There is no abdominal tenderness.  Musculoskeletal:     Cervical back: Neck supple.     Right lower leg: No edema.     Left lower leg: No edema.  Skin:    General: Skin is warm and dry.  Neurological:     General: No focal deficit present.     Mental Status: He is alert and oriented to person, place, and time.  Psychiatric:        Mood and Affect: Mood normal.        Behavior: Behavior normal.     ED Results / Procedures / Treatments   Labs (all labs ordered are listed, but only abnormal results are displayed) Labs Reviewed  POC SARS CORONAVIRUS 2 AG -  ED     EKG None  Radiology DG Chest Portable 1 View  Result Date: 05/30/2019 CLINICAL DATA:  Cough and shortness of breath. The patient is undergoing chemotherapy for colon cancer. EXAM: PORTABLE CHEST 1 VIEW COMPARISON:  CT chest, abdomen and pelvis 02/06/2019. FINDINGS: The lungs are clear. Heart size is normal. No pneumothorax or pleural fluid. No acute or focal bony abnormality. IMPRESSION: No acute disease. Electronically Signed   By: Inge Rise M.D.   On: 05/30/2019 14:23  Procedures Procedures (including critical care time)  Medications Ordered in ED Medications  sodium chloride 0.9 % bolus 1,000 mL (1,000 mLs Intravenous New Bag/Given 05/30/19 1509)  ondansetron (ZOFRAN) injection 4 mg (4 mg Intravenous Given 05/30/19 1509)    ED Course  I have reviewed the triage vital signs and the nursing notes.  Pertinent labs & imaging results that were available during my care of the patient were reviewed by me and considered in my medical decision making (see chart for details).    MDM Rules/Calculators/A&P                      64 year old colon cancer recently on chemotherapy with concern for dehydration, diarrhea.  On exam patient is well-appearing, he is in no acute distress, abdomen is soft.  Vital signs are stable, labs performed at cancer clinic showed very mild increase in his creatinine but otherwise normal electrolytes.  POC Covid is negative.  Will send PCR.  Suspect his symptoms are more likely related to his chemotherapy.  Will give patient fluids here.  Believe he is appropriate for outpatient management this time.  Recommend close recheck with his oncologist.  Patient agreeable, will discharge home after fluids.    After the discussed management above, the patient was determined to be safe for discharge.  The patient was in agreement with this plan and all questions regarding their care were answered.  ED return precautions were discussed and the patient will return to  the ED with any significant worsening of condition.   Final Clinical Impression(s) / ED Diagnoses Final diagnoses:  Diarrhea, unspecified type  Dehydration    Rx / DC Orders ED Discharge Orders    None       Lucrezia Starch, MD 05/30/19 1534

## 2019-05-30 NOTE — ED Notes (Signed)
Pt is stating he needs IV fluids, he is severally dehydrated. Also, still focuses on being here due to diarrhea not what was told in report yb Chemo Nurse.

## 2019-05-31 ENCOUNTER — Telehealth: Payer: Self-pay | Admitting: Nurse Practitioner

## 2019-05-31 ENCOUNTER — Telehealth: Payer: Self-pay | Admitting: *Deleted

## 2019-05-31 ENCOUNTER — Ambulatory Visit: Payer: BC Managed Care – PPO

## 2019-05-31 ENCOUNTER — Telehealth: Payer: Self-pay | Admitting: Adult Health

## 2019-05-31 DIAGNOSIS — C180 Malignant neoplasm of cecum: Secondary | ICD-10-CM

## 2019-05-31 MED ORDER — DIPHENOXYLATE-ATROPINE 2.5-0.025 MG PO TABS: 2.0000 | ORAL_TABLET | Freq: Four times a day (QID) | ORAL | 0 refills | Status: DC | PRN

## 2019-05-31 NOTE — Telephone Encounter (Addendum)
Left VM for [facility] infusion clinic with patient information to inquire if he qualifies for the COVID Remdesivir infusion? Notified by NP with Isaiah Gray that he does qualify for Remdesivir.

## 2019-05-31 NOTE — Telephone Encounter (Signed)
Called to Discuss with patient about Covid symptoms and the use of bamlanivimab, a monoclonal antibody infusion for those with mild to moderate Covid symptoms and at a high risk of hospitalization.     Pt is qualified for this infusion at the [facility] infusion center due to co-morbid conditions and/or a member of an at-risk group.     Unable to reach pt  

## 2019-05-31 NOTE — Telephone Encounter (Addendum)
Informed patient that the PCR Covid test was positive, so he will not be allowed to come to office for IV fluids. He is still having diarrhea and has not taken anything--instructed him to take Imodium #2 tablets QID as needed. He is drinking fluids fairly well, but still feels extreme fatigue and short of breath w/minimal exertion. Inquired if he wishes to be hospitalized or would he like to see if home health can see him and provide assessment and fluid administration. Per [place] w[facility], they do not have any nursing staff available to service patient. Suggested to reach out to Carolynn Sayers with pharmacy to arrange for fluids and they can possibly find another nursing agency to administer fluids. In meantime, our NP is looking into getting daily fluids at another infusion center. @ 11230: Notified by Carolynn Sayers at [facility]: They will not be able to assist him. Staff has reached out to seven home agencies and staffing does not allow them to admit to services at this time. Called patient and left VM to with above information, instructed to push po fluids and Lomotil has been called in for his diarrhea. Call during office hours if he needs a direct admit or go to ER if office closed if necessary.

## 2019-05-31 NOTE — Telephone Encounter (Signed)
Called patient at the request of Dr. Benay Spice for evaluation of candidacy for monoclonal antibody therapy.    Date Tested: 05/29/2019  Date of Symptom onset: 2 weeks ago (cough, sore throat, nasal congestion)   Symptoms: Improved, cough, diarrhea  I discussed with Isaiah Gray his covid positivity and his symptoms.  In retrospect he notes his sore throat and upper respiratory symptoms started 2 weeks ago.  He had attributed it to a head cold, but has slowly worsened.  He has had diarrhea all along, and that can be a common side effect of his treatment, so it is hard to tell which that is related to, so we opted to attribute that entirely to his treatment.  We discussed that he is at increased risk for COVID complications considering his recent chemotherapy, and his HTN.  However, we also discussed the monoclonal antibody therapy.  Since he is 2 weeks out from his symptom onset with congestion, cough, he does not meet criteria to receive the monoclonal infusion.  I discussed this with Dr. Benay Spice in detail over the phone, and we brainstormed ways we can potentially help him with his symptom management.  I reviewed this with [name] RN, and the other APPs on the infusion screening team.  He can go to the respiratory clinic, so long as he sees his PCP by video visit that day and gets scheduled for an appointment if he would benefit from IV fluids.  The clinic is open tonight from 530-730 and then next week Monday through Friday from 530-730p.  There is nowhere that the patient can receive IV fluids over the weekend other than the emergency room.        [name] and I discussed his diarrhea and hydration in detail. He is going to drink pedialyte as he has been.  He is taking imodium for his diarrhea.  We can prescribe lomotil or questran as well.  Dr. Benay Spice plans on having his nurse call [name] this afternoon and then we can send in lomotil if needed.    I reviewed with him isolation requirements for COVID.  He  understands.  He knows to call if he gets worse, ER precautions reviewed.     Wilber Bihari, NP

## 2019-06-01 ENCOUNTER — Inpatient Hospital Stay: Payer: BC Managed Care – PPO

## 2019-06-02 ENCOUNTER — Encounter (HOSPITAL_COMMUNITY): Payer: Self-pay

## 2019-06-02 ENCOUNTER — Other Ambulatory Visit: Payer: Self-pay

## 2019-06-02 ENCOUNTER — Emergency Department (HOSPITAL_COMMUNITY): Payer: BC Managed Care – PPO

## 2019-06-02 ENCOUNTER — Inpatient Hospital Stay (HOSPITAL_COMMUNITY)
Admission: EM | Admit: 2019-06-02 | Discharge: 2019-06-05 | DRG: 871 | Disposition: A | Discharge status: Home / Self Care | Payer: BC Managed Care – PPO | Attending: Internal Medicine | Admitting: Internal Medicine

## 2019-06-02 DIAGNOSIS — Z8249 Family history of ischemic heart disease and other diseases of the circulatory system: Secondary | ICD-10-CM

## 2019-06-02 DIAGNOSIS — Z800 Family history of malignant neoplasm of digestive organs: Secondary | ICD-10-CM

## 2019-06-02 DIAGNOSIS — R0602 Shortness of breath: Secondary | ICD-10-CM | POA: Diagnosis present

## 2019-06-02 DIAGNOSIS — E876 Hypokalemia: Secondary | ICD-10-CM | POA: Diagnosis present

## 2019-06-02 DIAGNOSIS — K219 Gastro-esophageal reflux disease without esophagitis: Secondary | ICD-10-CM | POA: Diagnosis present

## 2019-06-02 DIAGNOSIS — Z9049 Acquired absence of other specified parts of digestive tract: Secondary | ICD-10-CM

## 2019-06-02 DIAGNOSIS — I10 Essential (primary) hypertension: Secondary | ICD-10-CM | POA: Diagnosis present

## 2019-06-02 DIAGNOSIS — A4189 Other specified sepsis: Secondary | ICD-10-CM | POA: Diagnosis present

## 2019-06-02 DIAGNOSIS — U071 COVID-19: Secondary | ICD-10-CM | POA: Diagnosis present

## 2019-06-02 DIAGNOSIS — J9621 Acute and chronic respiratory failure with hypoxia: Secondary | ICD-10-CM

## 2019-06-02 DIAGNOSIS — Z7982 Long term (current) use of aspirin: Secondary | ICD-10-CM

## 2019-06-02 DIAGNOSIS — J9601 Acute respiratory failure with hypoxia: Secondary | ICD-10-CM

## 2019-06-02 DIAGNOSIS — J1282 Pneumonia due to coronavirus disease 2019: Secondary | ICD-10-CM | POA: Diagnosis present

## 2019-06-02 DIAGNOSIS — Z85038 Personal history of other malignant neoplasm of large intestine: Secondary | ICD-10-CM

## 2019-06-02 DIAGNOSIS — Z9221 Personal history of antineoplastic chemotherapy: Secondary | ICD-10-CM

## 2019-06-02 DIAGNOSIS — A419 Sepsis, unspecified organism: Secondary | ICD-10-CM

## 2019-06-02 LAB — CBC WITH DIFFERENTIAL/PLATELET
Abs Immature Granulocytes: 0.17 10*3/uL — ABNORMAL HIGH (ref 0.00–0.07)
Basophils Absolute: 0 10*3/uL (ref 0.0–0.1)
Basophils Relative: 0 %
Eosinophils Absolute: 0.1 10*3/uL (ref 0.0–0.5)
Eosinophils Relative: 1 %
HCT: 34.9 % — ABNORMAL LOW (ref 39.0–52.0)
Hemoglobin: 12.1 g/dL — ABNORMAL LOW (ref 13.0–17.0)
Immature Granulocytes: 2 %
Lymphocytes Relative: 8 %
Lymphs Abs: 0.9 10*3/uL (ref 0.7–4.0)
MCH: 30.4 pg (ref 26.0–34.0)
MCHC: 34.7 g/dL (ref 30.0–36.0)
MCV: 87.7 fL (ref 80.0–100.0)
Monocytes Absolute: 1.7 10*3/uL — ABNORMAL HIGH (ref 0.1–1.0)
Monocytes Relative: 15 %
Neutro Abs: 8.1 10*3/uL — ABNORMAL HIGH (ref 1.7–7.7)
Neutrophils Relative %: 74 %
Platelets: 232 10*3/uL (ref 150–400)
RBC: 3.98 MIL/uL — ABNORMAL LOW (ref 4.22–5.81)
RDW: 17.2 % — ABNORMAL HIGH (ref 11.5–15.5)
WBC: 10.9 10*3/uL — ABNORMAL HIGH (ref 4.0–10.5)
nRBC: 0 % (ref 0.0–0.2)

## 2019-06-02 LAB — LACTATE DEHYDROGENASE: LDH: 303 U/L — ABNORMAL HIGH (ref 98–192)

## 2019-06-02 LAB — PROCALCITONIN: Procalcitonin: 0.79 ng/mL

## 2019-06-02 LAB — COMPREHENSIVE METABOLIC PANEL
ALT: 43 U/L (ref 0–44)
AST: 51 U/L — ABNORMAL HIGH (ref 15–41)
Albumin: 2.4 g/dL — ABNORMAL LOW (ref 3.5–5.0)
Alkaline Phosphatase: 102 U/L (ref 38–126)
Anion gap: 10 (ref 5–15)
BUN: 16 mg/dL (ref 8–23)
CO2: 24 mmol/L (ref 22–32)
Calcium: 8.4 mg/dL — ABNORMAL LOW (ref 8.9–10.3)
Chloride: 100 mmol/L (ref 98–111)
Creatinine, Ser: 1.07 mg/dL (ref 0.61–1.24)
GFR calc Af Amer: >60 mL/min (ref >60)
GFR calc non Af Amer: >60 mL/min (ref >60)
Glucose, Bld: 120 mg/dL — ABNORMAL HIGH (ref 70–99)
Potassium: 3.4 mmol/L — ABNORMAL LOW (ref 3.5–5.1)
Sodium: 134 mmol/L — ABNORMAL LOW (ref 135–145)
Total Bilirubin: 1.3 mg/dL — ABNORMAL HIGH (ref 0.3–1.2)
Total Protein: 5.8 g/dL — ABNORMAL LOW (ref 6.5–8.1)

## 2019-06-02 LAB — FERRITIN: Ferritin: 465 ng/mL — ABNORMAL HIGH (ref 24–336)

## 2019-06-02 LAB — TRIGLYCERIDES: Triglycerides: 89 mg/dL (ref <150)

## 2019-06-02 LAB — LACTIC ACID, PLASMA: Lactic Acid, Venous: 1.5 mmol/L (ref 0.5–1.9)

## 2019-06-02 LAB — C-REACTIVE PROTEIN: CRP: 20.2 mg/dL — ABNORMAL HIGH (ref <1.0)

## 2019-06-02 LAB — D-DIMER, QUANTITATIVE: D-Dimer, Quant: 2.02 ug/mL-FEU — ABNORMAL HIGH (ref 0.00–0.50)

## 2019-06-02 LAB — FIBRINOGEN: Fibrinogen: >800 mg/dL — ABNORMAL HIGH (ref 210–475)

## 2019-06-02 MED ORDER — DEXAMETHASONE SODIUM PHOSPHATE 10 MG/ML IJ SOLN
10.0000 mg | Freq: Once | INTRAMUSCULAR | Status: AC
  Administered 2019-06-02: 10 mg via INTRAVENOUS
  Filled 2019-06-02: qty 1

## 2019-06-02 MED ORDER — DEXAMETHASONE 6 MG PO TABS: 6.0000 mg | ORAL_TABLET | Freq: Every day | ORAL | Status: DC

## 2019-06-02 MED ORDER — SODIUM CHLORIDE 0.9 % IV SOLN
100.0000 mg | Freq: Every day | INTRAVENOUS | Status: DC
  Administered 2019-06-03 – 2019-06-05 (×3): 100 mg via INTRAVENOUS
  Filled 2019-06-02 (×3): qty 20

## 2019-06-02 MED ORDER — IPRATROPIUM-ALBUTEROL 20-100 MCG/ACT IN AERS
2.0000 | INHALATION_SPRAY | Freq: Four times a day (QID) | RESPIRATORY_TRACT | Status: DC
  Administered 2019-06-03 – 2019-06-05 (×10): 2 via RESPIRATORY_TRACT
  Filled 2019-06-02: qty 4

## 2019-06-02 MED ORDER — SODIUM CHLORIDE 0.9 % IV SOLN
200.0000 mg | Freq: Once | INTRAVENOUS | Status: AC
  Administered 2019-06-02: 200 mg via INTRAVENOUS
  Filled 2019-06-02: qty 40
  Filled 2019-06-02: qty 200

## 2019-06-02 NOTE — H&P (Signed)
History and Physical    Isaiah Gray DOB: 04-27-1956 DOA: 06/02/2019  PCP: Derrill Center., MD  Patient coming from: Home  Chief Complaint: I have worsening shortness of breath and generalized weakness.  HPI: Isaiah Gray is a 64 y.o. male with medical history significant of stage III colon cancer status post right hemicolectomy in 1 05/2018.  Other comorbidities include chronic essential hypertension, GERD.  For his stage III cancer, patient had been on chemotherapy up until 01/13.  As per patient, he developed significant diarrhea post chemotherapy with associated generalized weakness,weight loss.  Due to generalized weakness, patient had presented to the emergency room on 05/30/2019 with generalized weakness.  He was treated for dehydration with IV fluids and discharged home.  His Covid test done during his short course in the ED came back positive.  Patient was called and informed about Covid status.  Over the past few days, patient has noted worsening generalized weakness and exertional dyspnea has his presentation.  On admission, patient was reported to have been hypoxic.  He denied any URI symptoms or cough.  He denied any fever but temperature on admission was 101 Fahrenheit.  He had required oxygen supplementation to maintain sats above 90%.  Currently on 2 L/min with oxygen sats at 96%.  He denies any associated abdominal pain.  His reported diarrhea was clear with no blood or mucus appreciated.    ED Course: Reported to have been hypoxic on presentation.  Chest x-ray shows bilateral infectious changes consistent with viral pneumonia.  Decadron and remdesivir given in the ED.  Review of Systems: As per HPI otherwise 10 point review of systems negative.    Past Medical History:  Diagnosis Date  . Cataract 2015   right ecl with lens inplant  . GERD (gastroesophageal reflux disease)   . HTN (hypertension)   . Primary cancer of cecum ([place]) dx'd 01/25/19    Past  Surgical History:  Procedure Laterality Date  . ENDOSCOPIC VEIN LASER TREATMENT Bilateral 2020   legs   . INGUINAL HERNIA REPAIR Right    age 23  . LAPAROSCOPIC PARTIAL COLECTOMY Right 03/15/2019   Procedure: LAPAROSCOPIC RIGHT HEMICOLECTOMY;  Surgeon: Ileana Roup, MD;  Location: [facility];  Service: General;  Laterality: Right;  . MENISCUS REPAIR Bilateral   . VARICOSE VEIN SURGERY Right      reports that he has never smoked. He has never used smokeless tobacco. He reports current alcohol use. He reports current drug use. Drug: Marijuana.  No Known Allergies  Family History  Problem Relation Age of Onset  . Hypertension Mother   . Hypertension Father   . Colon cancer Maternal Grandmother 86  . Cancer Paternal Grandmother        ? intestinal     Prior to Admission medications   Medication Sig Start Date End Date Taking? Authorizing Provider  aspirin EC 81 MG tablet Take 81 mg by mouth daily.    Yes [provider]  diphenoxylate-atropine (LOMOTIL) 2.5-0.025 MG tablet Take 2 tablets by mouth 4 (four) times daily as needed for diarrhea or loose stools (maximum 8/day). 05/31/19  Yes Ladell Pier, MD  Loperamide HCl (IMODIUM A-D PO) Take 2 tablets by mouth every 8 (eight) hours as needed (diarrhea).   Yes [provider]  losartan (COZAAR) 50 MG tablet Take 50 mg by mouth daily. 12/03/18  Yes [provider]  omeprazole (PRILOSEC OTC) 20 MG tablet Take 1 tablet (20 mg total) by  mouth 2 (two) times daily. Patient taking differently: Take 20 mg by mouth daily as needed (reflux).  01/10/19  Yes Armbruster, Carlota Raspberry, MD  ondansetron (ZOFRAN) 8 MG tablet Take 1 tablet (8 mg total) by mouth every 8 (eight) hours as needed for nausea or vomiting. Start 72 hours after IV chemo treatment 04/10/19  Yes Ladell Pier, MD  prochlorperazine (COMPAZINE) 10 MG tablet Take 1 tablet (10 mg total) by mouth every 6 (six) hours as needed. Patient taking differently: Take  10 mg by mouth every 6 (six) hours as needed for nausea or vomiting.  04/10/19  Yes Ladell Pier, MD  Red Yeast Rice Extract (RED YEAST RICE PO) Take 2 capsules by mouth 2 (two) times daily.   Yes [provider]  sildenafil (REVATIO) 20 MG tablet Take 20 mg by mouth daily as needed (ED).   Yes [provider]  capecitabine (XELODA) 500 MG tablet TAKE 4 TABLETS (2000MG ) IN AM AND 3 TABLETS (1500MG ) IN PM, IMMEDIATELY AFTER MEALS. TAKE FOR 14 DAYS ON, 7 DAYS OFF, REPEAT. TAKE BY MOUTH. Patient not taking: Reported on 06/02/2019 05/22/19   Ladell Pier, MD  sucralfate (CARAFATE) 1 GM/10ML suspension Take 10 mLs (1 g total) by mouth every 6 (six) hours as needed. Patient not taking: Reported on 01/25/2019 01/10/19   Yetta Flock, MD    Physical Exam: Vitals:   06/02/19 2200 06/02/19 2230 06/02/19 2245 06/02/19 2300  BP: 140/79 139/81  (!) 143/86  Pulse: 84 83 83 80  Resp: (!) 30 (!) 26 (!) 26 (!) 29  Temp:      TempSrc:      SpO2: 97% 97% 97% 97%  Weight:      Height:        Constitutional: Cachetic looking, NAD, calm, comfortable Vitals:   06/02/19 2200 06/02/19 2230 06/02/19 2245 06/02/19 2300  BP: 140/79 139/81  (!) 143/86  Pulse: 84 83 83 80  Resp: (!) 30 (!) 26 (!) 26 (!) 29  Temp:      TempSrc:      SpO2: 97% 97% 97% 97%  Weight:      Height:       Eyes: PERRL, lids and conjunctivae normal ENMT: Mucous membranes are moist. Posterior pharynx clear of any exudate or lesions.Normal dentition.  Neck: normal, supple, no masses, no thyromegaly Respiratory:Diminshed breath sounds, no wheezing, no crackles. Normal respiratory effort. No accessory muscle use.  Cardiovascular: Regular rate and rhythm, no murmurs / rubs / gallops. No extremity edema. 2+ pedal pulses. No carotid bruits.  Abdomen: no tenderness, no masses palpated. No hepatosplenomegaly. Bowel sounds positive.  Musculoskeletal: no clubbing / cyanosis. No joint deformity upper and lower  extremities. Good ROM, no contractures. Normal muscle tone.  Skin: no rashes, lesions, ulcers. No induration Neurologic: CN 2-12 grossly intact. Sensation intact, DTR normal. Strength 5/5 in all 4.  Psychiatric: Normal judgment and insight. Alert and oriented x 3. Normal mood.     Labs on Admission: I have personally reviewed following labs and imaging studies  CBC: Recent Labs  Lab 05/30/19 1159 06/02/19 2141  WBC 6.9 10.9*  NEUTROABS 4.7 8.1*  HGB 14.0 12.1*  HCT 40.1 34.9*  MCV 89.1 87.7  PLT 180 [id number]   Basic Metabolic Panel: Recent Labs  Lab 05/30/19 1159 06/02/19 2141  NA 140 134*  K 4.1 3.4*  CL 104 100  CO2 24 24  GLUCOSE 115* 120*  BUN 20 16  CREATININE 1.29* 1.07  CALCIUM 9.3 8.4*   GFR: Estimated Creatinine Clearance: 73.9 mL/min (by C-G formula based on SCr of 1.07 mg/dL). Liver Function Tests: Recent Labs  Lab 05/30/19 1159 06/02/19 2141  AST 48* 51*  ALT 43 43  ALKPHOS 115 102  BILITOT 0.7 1.3*  PROT 6.7 5.8*  ALBUMIN 3.2* 2.4*   No results for input(s): LIPASE, AMYLASE in the last 168 hours. No results for input(s): AMMONIA in the last 168 hours. Coagulation Profile: No results for input(s): INR, PROTIME in the last 168 hours. Cardiac Enzymes: No results for input(s): CKTOTAL, CKMB, CKMBINDEX, TROPONINI in the last 168 hours. BNP (last 3 results) No results for input(s): PROBNP in the last 8760 hours. HbA1C: No results for input(s): HGBA1C in the last 72 hours. CBG: No results for input(s): GLUCAP in the last 168 hours. Lipid Profile: Recent Labs    06/02/19 2141  TRIG 89   Thyroid Function Tests: No results for input(s): TSH, T4TOTAL, FREET4, T3FREE, THYROIDAB in the last 72 hours. Anemia Panel: Recent Labs    06/02/19 2141  FERRITIN 465*   Urine analysis: No results found for: COLORURINE, APPEARANCEUR, LABSPEC, PHURINE, GLUCOSEU, HGBUR, BILIRUBINUR, KETONESUR, PROTEINUR, UROBILINOGEN, NITRITE, LEUKOCYTESUR  Radiological  Exams on Admission: DG Chest Port 1 View  Result Date: 06/02/2019 CLINICAL DATA:  Shortness of breath. EXAM: PORTABLE CHEST 1 VIEW COMPARISON:  June 02, 2019 FINDINGS: Mild hazy infiltrates are seen within the bilateral lung bases and lateral aspect of the mid right lung. There is no evidence of a pleural effusion or pneumothorax. The heart size and mediastinal contours are within normal limits. The visualized skeletal structures are unremarkable. IMPRESSION: 1. Mild, bilateral hazy infiltrates, right greater than left. Electronically Signed   By: Virgina Norfolk M.D.   On: 06/02/2019 21:08    EKG: Independently reviewed.  Assessment/Plan Active Problems:   Pneumonia due to COVID-19 virus #1. Acute Bilateral pneumonia with acute Hypoxic respiratory failure secondary to COVID 19 infection.  Patient was initiated on remdesivir and Decadron.  Zinc and vitamin C was also offered.  Continue with oxygen supplementation.  Incentive spirometry advised.  Proning maneuvers as tolerated by patient.Admit to [facility]  #2.  History of stage III colon cancer status post right hemicolectomy and status post chemotherapy.  #3.  Chronic essential hypertension: Blood pressure stable.\  #4.  Chronic GERD: Continue with PPIs and home medications.  Due to concerns for worsening symptoms with Decadron, caution is advised.  #5.  Elevated D-dimer, likely an acute phase reactant to COVID-19.  Consider CT angiogram if needed.  #6. Hypokalemia- Being repleted in IV fluids.Follow up chem 7 in a.m  DVT prophylaxis: Lovenox Code Status: Full CODE Family Communication: Wife [phone] Disposition Plan: Home Consults called:  Admission status: Inpatient  [place] MD [facility] Pager [phone]  If 7PM-7AM, please contact night-coverage [email or web address] Password [facility]  06/02/2019, 11:51 PM

## 2019-06-02 NOTE — ED Provider Notes (Signed)
[facility] DEPT Provider Note   CSN: [id number] Arrival date & time: 06/02/19  2018     History Chief Complaint  Patient presents with  . Shortness of Breath    CHARL MENGESHA is a 64 y.o. male.  The history is provided by the patient.  Shortness of Breath Severity:  Moderate Onset quality:  Gradual Timing:  Constant Progression:  Worsening Chronicity:  New Context: URI (covid positive)   Relieved by:  Rest Worsened by:  Exertion Associated symptoms: cough   Associated symptoms: no abdominal pain, no chest pain, no ear pain, no fever, no rash, no sore throat and no vomiting   Risk factors: hx of cancer        Past Medical History:  Diagnosis Date  . Cataract 2015   right ecl with lens inplant  . GERD (gastroesophageal reflux disease)   . HTN (hypertension)   . Primary cancer of cecum ([place]) dx'd 01/25/19    Patient Active Problem List   Diagnosis Date Noted  . GERD (gastroesophageal reflux disease)   . Primary cancer of cecum s/p lap right proximal colectomy 03/15/2019   . S/P right hemicolectomy 03/15/2019  . Benign essential hypertension 06/22/2015  . Chronic dryness of both eyes 06/22/2015  . Cortical age-related cataract of left eye 06/22/2015  . ED (erectile dysfunction) 06/22/2015  . Impaired fasting glucose 06/22/2015  . Pigmentary glaucoma of both eyes, indeterminate stage 06/22/2015  . Pseudophakia, right eye 06/22/2015  . Cataract 2015    Past Surgical History:  Procedure Laterality Date  . ENDOSCOPIC VEIN LASER TREATMENT Bilateral 2020   legs   . INGUINAL HERNIA REPAIR Right    age 31  . LAPAROSCOPIC PARTIAL COLECTOMY Right 03/15/2019   Procedure: LAPAROSCOPIC RIGHT HEMICOLECTOMY;  Surgeon: Ileana Roup, MD;  Location: [facility];  Service: General;  Laterality: Right;  . MENISCUS REPAIR Bilateral   . VARICOSE VEIN SURGERY Right        Family History  Problem Relation Age of Onset  . Hypertension Mother    . Hypertension Father   . Colon cancer Maternal Grandmother 86  . Cancer Paternal Grandmother        ? intestinal    Social History   Tobacco Use  . Smoking status: Never Smoker  . Smokeless tobacco: Never Used  Substance Use Topics  . Alcohol use: Yes    Comment: weekends  . Drug use: Yes    Types: Marijuana    Comment: 03-12-2019 , last use x 1 month ago     Home Medications Prior to Admission medications   Medication Sig Start Date End Date Taking? Authorizing Provider  aspirin EC 81 MG tablet Take 81 mg by mouth daily.     [provider]  capecitabine (XELODA) 500 MG tablet TAKE 4 TABLETS (2000MG ) IN AM AND 3 TABLETS (1500MG ) IN PM, IMMEDIATELY AFTER MEALS. TAKE FOR 14 DAYS ON, 7 DAYS OFF, REPEAT. TAKE BY MOUTH. 05/22/19   Ladell Pier, MD  diphenoxylate-atropine (LOMOTIL) 2.5-0.025 MG tablet Take 2 tablets by mouth 4 (four) times daily as needed for diarrhea or loose stools (maximum 8/day). 05/31/19   Ladell Pier, MD  losartan (COZAAR) 50 MG tablet Take 50 mg by mouth daily. 12/03/18   [provider]  omeprazole (PRILOSEC OTC) 20 MG tablet Take 1 tablet (20 mg total) by mouth 2 (two) times daily. 01/10/19   Armbruster, Carlota Raspberry, MD  ondansetron (ZOFRAN) 8 MG tablet Take 1 tablet (  8 mg total) by mouth every 8 (eight) hours as needed for nausea or vomiting. Start 72 hours after IV chemo treatment 04/10/19   Ladell Pier, MD  prochlorperazine (COMPAZINE) 10 MG tablet Take 1 tablet (10 mg total) by mouth every 6 (six) hours as needed. 04/10/19   Ladell Pier, MD  Red Yeast Rice Extract (RED YEAST RICE PO) Take 2 capsules by mouth 2 (two) times daily.    [provider]  sildenafil (REVATIO) 20 MG tablet Take 20 mg by mouth daily as needed (ED).    [provider]  sucralfate (CARAFATE) 1 GM/10ML suspension Take 10 mLs (1 g total) by mouth every 6 (six) hours as needed. Patient not taking: Reported on 01/25/2019 01/10/19   Armbruster,  Carlota Raspberry, MD    Allergies    Patient has no known allergies.  Review of Systems   Review of Systems  Constitutional: Negative for chills and fever.  HENT: Negative for ear pain and sore throat.   Eyes: Negative for pain and visual disturbance.  Respiratory: Positive for cough and shortness of breath.   Cardiovascular: Negative for chest pain and palpitations.  Gastrointestinal: Positive for diarrhea. Negative for abdominal pain and vomiting.  Genitourinary: Negative for dysuria and hematuria.  Musculoskeletal: Negative for arthralgias and back pain.  Skin: Negative for color change and rash.  Neurological: Negative for seizures and syncope.  All other systems reviewed and are negative.   Physical Exam Updated Vital Signs  ED Triage Vitals  Enc Vitals Group     BP 06/02/19 2123 130/80     Pulse Rate 06/02/19 2123 88     Resp 06/02/19 2123 (!) 23     Temp 06/02/19 2123 (!) 101.4 F (38.6 C)     Temp Source 06/02/19 2123 Oral     SpO2 06/02/19 2122 96 %     Weight 06/02/19 2125 166 lb (75.3 kg)     Height 06/02/19 2125 5\' 10"  (1.778 m)     Head Circumference --      Peak Flow --      Pain Score 06/02/19 2125 0     Pain Loc --      Pain Edu? --      Excl. in [place]? --      Physical Exam Vitals and nursing note reviewed.  Constitutional:      General: He is not in acute distress.    Appearance: He is well-developed. He is ill-appearing.  HENT:     Head: Normocephalic and atraumatic.  Eyes:     Extraocular Movements: Extraocular movements intact.     Conjunctiva/sclera: Conjunctivae normal.     Pupils: Pupils are equal, round, and reactive to light.  Cardiovascular:     Rate and Rhythm: Normal rate and regular rhythm.     Heart sounds: No murmur.  Pulmonary:     Effort: Pulmonary effort is normal. Tachypnea present. No respiratory distress.     Breath sounds: Decreased breath sounds present.  Abdominal:     Palpations: Abdomen is soft.     Tenderness: There is no  abdominal tenderness.  Musculoskeletal:     Cervical back: Normal range of motion and neck supple.     Right lower leg: No edema.     Left lower leg: No edema.  Skin:    General: Skin is warm and dry.  Neurological:     General: No focal deficit present.     Mental Status: He is alert.  ED Results / Procedures / Treatments   Labs (all labs ordered are listed, but only abnormal results are displayed) Labs Reviewed  CBC WITH DIFFERENTIAL/PLATELET - Abnormal; Notable for the following components:      Result Value   WBC 10.9 (*)    RBC 3.98 (*)    Hemoglobin 12.1 (*)    HCT 34.9 (*)    RDW 17.2 (*)    Neutro Abs 8.1 (*)    Monocytes Absolute 1.7 (*)    Abs Immature Granulocytes 0.17 (*)    All other components within normal limits  COMPREHENSIVE METABOLIC PANEL - Abnormal; Notable for the following components:   Sodium 134 (*)    Potassium 3.4 (*)    Glucose, Bld 120 (*)    Calcium 8.4 (*)    Total Protein 5.8 (*)    Albumin 2.4 (*)    AST 51 (*)    Total Bilirubin 1.3 (*)    All other components within normal limits  D-DIMER, QUANTITATIVE (NOT AT [facility]) - Abnormal; Notable for the following components:   D-Dimer, Quant 2.02 (*)    All other components within normal limits  LACTATE DEHYDROGENASE - Abnormal; Notable for the following components:   LDH 303 (*)    All other components within normal limits  FIBRINOGEN - Abnormal; Notable for the following components:   Fibrinogen >800 (*)    All other components within normal limits  CULTURE, BLOOD (ROUTINE X 2)  CULTURE, BLOOD (ROUTINE X 2)  URINE CULTURE  LACTIC ACID, PLASMA  LACTIC ACID, PLASMA  PROCALCITONIN  FERRITIN  TRIGLYCERIDES  C-REACTIVE PROTEIN  URINALYSIS, ROUTINE W REFLEX MICROSCOPIC    EKG None  Radiology DG Chest Port 1 View  Result Date: 06/02/2019 CLINICAL DATA:  Shortness of breath. EXAM: PORTABLE CHEST 1 VIEW COMPARISON:  June 02, 2019 FINDINGS: Mild hazy infiltrates are seen within  the bilateral lung bases and lateral aspect of the mid right lung. There is no evidence of a pleural effusion or pneumothorax. The heart size and mediastinal contours are within normal limits. The visualized skeletal structures are unremarkable. IMPRESSION: 1. Mild, bilateral hazy infiltrates, right greater than left. Electronically Signed   By: Virgina Norfolk M.D.   On: 06/02/2019 21:08    Procedures .Critical Care Performed by: Lennice Sites, DO Authorized by: Lennice Sites, DO   Critical care provider statement:    Critical care time (minutes):  40   Critical care was necessary to treat or prevent imminent or life-threatening deterioration of the following conditions:  Sepsis and respiratory failure   Critical care was time spent personally by me on the following activities:  Blood draw for specimens, development of treatment plan with patient or surrogate, discussions with primary provider, evaluation of patient's response to treatment, examination of patient, obtaining history from patient or surrogate, ordering and performing treatments and interventions, ordering and review of laboratory studies, ordering and review of radiographic studies, pulse oximetry, re-evaluation of patient's condition and review of old charts   I assumed direction of critical care for this patient from another provider in my specialty: no     (including critical care time)  Medications Ordered in ED Medications  remdesivir 200 mg in sodium chloride 0.9% 250 mL IVPB (has no administration in time range)    Followed by  remdesivir 100 mg in sodium chloride 0.9 % 100 mL IVPB (has no administration in time range)  dexamethasone (DECADRON) injection 10 mg (10 mg Intravenous Given 06/02/19 2142)  ED Course  I have reviewed the triage vital signs and the nursing notes.  Pertinent labs & imaging results that were available during my care of the patient were reviewed by me and considered in my medical decision  making (see chart for details).    MDM Rules/Calculators/A&P                      MONTAE KRUPA is a 63 year old male with history of colon cancer actively undergoing chemotherapy who recently tested positive for coronavirus with worsening shortness of breath, cough.  Patient hypoxic with ambulation to the upper 80s.  Placed on 2 L of oxygen.  Will give Decadron.  Covid screening labs have been ordered.  Patient with some diarrhea but may be secondary to chemotherapy.  Feels dehydrated.  Has general weakness.  Coarse breath sounds.  Will get Covid screening labs and will admit given new hypoxia.  X-ray concerning for infectious changes likely in the setting of coronavirus.  Otherwise lab work is overall unremarkable except for elevation of inflammatory markers.  No significant leukocytosis, anemia, electrolyte abnormality.  Patient with sepsis from coronavirus.  Decadron given, remdesivir given.  Will admit for further care given hypoxia and fever.  This chart was dictated using voice recognition software.  Despite best efforts to proofread,  errors can occur which can change the documentation meaning.   ADRICK SCHNEIDERS was evaluated in Emergency Department on 06/02/2019 for the symptoms described in the history of present illness. He was evaluated in the context of the global COVID-19 pandemic, which necessitated consideration that the patient might be at risk for infection with the SARS-CoV-2 virus that causes COVID-19. Institutional protocols and algorithms that pertain to the evaluation of patients at risk for COVID-19 are in a state of rapid change based on information released by regulatory bodies including the CDC and federal and state organizations. These policies and algorithms were followed during the patient's care in the ED.  Final Clinical Impression(s) / ED Diagnoses Final diagnoses:  Acute respiratory failure with hypoxia ([place])  COVID-19  Sepsis, due to unspecified organism,  unspecified whether acute organ dysfunction present [facility])    Rx / DC Orders ED Discharge Orders    None       Lennice Sites, DO 06/02/19 2251

## 2019-06-03 DIAGNOSIS — J9601 Acute respiratory failure with hypoxia: Secondary | ICD-10-CM

## 2019-06-03 LAB — ABO/RH: ABO/RH(D): A POS

## 2019-06-03 LAB — BASIC METABOLIC PANEL
Anion gap: 15 (ref 5–15)
BUN: 20 mg/dL (ref 8–23)
CO2: 24 mmol/L (ref 22–32)
Calcium: 9.2 mg/dL (ref 8.9–10.3)
Chloride: 99 mmol/L (ref 98–111)
Creatinine, Ser: 1.07 mg/dL (ref 0.61–1.24)
GFR calc Af Amer: >60 mL/min (ref >60)
GFR calc non Af Amer: >60 mL/min (ref >60)
Glucose, Bld: 173 mg/dL — ABNORMAL HIGH (ref 70–99)
Potassium: 3.5 mmol/L (ref 3.5–5.1)
Sodium: 138 mmol/L (ref 135–145)

## 2019-06-03 LAB — CBC
HCT: 42.5 % (ref 39.0–52.0)
Hemoglobin: 14.6 g/dL (ref 13.0–17.0)
MCH: 30.8 pg (ref 26.0–34.0)
MCHC: 34.4 g/dL (ref 30.0–36.0)
MCV: 89.7 fL (ref 80.0–100.0)
Platelets: 316 10*3/uL (ref 150–400)
RBC: 4.74 MIL/uL (ref 4.22–5.81)
RDW: 17.2 % — ABNORMAL HIGH (ref 11.5–15.5)
WBC: 6.9 10*3/uL (ref 4.0–10.5)
nRBC: 0 % (ref 0.0–0.2)

## 2019-06-03 LAB — C-REACTIVE PROTEIN: CRP: 25.1 mg/dL — ABNORMAL HIGH (ref <1.0)

## 2019-06-03 LAB — URINALYSIS, ROUTINE W REFLEX MICROSCOPIC
Bacteria, UA: NONE SEEN
Bilirubin Urine: NEGATIVE
Glucose, UA: NEGATIVE mg/dL
Ketones, ur: NEGATIVE mg/dL
Leukocytes,Ua: NEGATIVE
Nitrite: NEGATIVE
Protein, ur: 100 mg/dL — AB
Specific Gravity, Urine: 1.021 (ref 1.005–1.030)
pH: 6 (ref 5.0–8.0)

## 2019-06-03 LAB — URINE CULTURE: Culture: NO GROWTH

## 2019-06-03 LAB — D-DIMER, QUANTITATIVE: D-Dimer, Quant: 1.99 ug/mL-FEU — ABNORMAL HIGH (ref 0.00–0.50)

## 2019-06-03 LAB — BRAIN NATRIURETIC PEPTIDE: B Natriuretic Peptide: 49.9 pg/mL (ref 0.0–100.0)

## 2019-06-03 LAB — MAGNESIUM: Magnesium: 2.3 mg/dL (ref 1.7–2.4)

## 2019-06-03 LAB — LACTIC ACID, PLASMA: Lactic Acid, Venous: 1.1 mmol/L (ref 0.5–1.9)

## 2019-06-03 MED ORDER — LOSARTAN POTASSIUM 25 MG PO TABS
50.0000 mg | ORAL_TABLET | Freq: Every day | ORAL | Status: DC
  Administered 2019-06-03 – 2019-06-05 (×3): 50 mg via ORAL
  Filled 2019-06-03 (×3): qty 2

## 2019-06-03 MED ORDER — ONDANSETRON HCL 4 MG PO TABS: 8.0000 mg | ORAL_TABLET | Freq: Three times a day (TID) | ORAL | Status: DC | PRN

## 2019-06-03 MED ORDER — AMLODIPINE BESYLATE 10 MG PO TABS
10.0000 mg | ORAL_TABLET | Freq: Every day | ORAL | Status: DC
  Administered 2019-06-03 – 2019-06-04 (×2): 10 mg via ORAL
  Filled 2019-06-03 (×3): qty 1

## 2019-06-03 MED ORDER — POTASSIUM CHLORIDE CRYS ER 20 MEQ PO TBCR
40.0000 meq | EXTENDED_RELEASE_TABLET | Freq: Once | ORAL | Status: AC
  Administered 2019-06-03: 40 meq via ORAL
  Filled 2019-06-03: qty 2

## 2019-06-03 MED ORDER — POTASSIUM CHLORIDE IN NACL 20-0.9 MEQ/L-% IV SOLN
INTRAVENOUS | Status: DC
  Filled 2019-06-03: qty 1000

## 2019-06-03 MED ORDER — ENOXAPARIN SODIUM 40 MG/0.4ML ~~LOC~~ SOLN
40.0000 mg | SUBCUTANEOUS | Status: DC
  Administered 2019-06-03 – 2019-06-04 (×2): 40 mg via SUBCUTANEOUS
  Filled 2019-06-03 (×2): qty 0.4

## 2019-06-03 MED ORDER — PANTOPRAZOLE SODIUM 40 MG PO TBEC
40.0000 mg | DELAYED_RELEASE_TABLET | Freq: Every day | ORAL | Status: DC
  Administered 2019-06-03 – 2019-06-05 (×3): 40 mg via ORAL
  Filled 2019-06-03 (×3): qty 1

## 2019-06-03 MED ORDER — PROCHLORPERAZINE MALEATE 10 MG PO TABS
10.0000 mg | ORAL_TABLET | Freq: Four times a day (QID) | ORAL | Status: DC | PRN
  Filled 2019-06-03: qty 1

## 2019-06-03 MED ORDER — ZINC SULFATE 220 (50 ZN) MG PO CAPS
220.0000 mg | ORAL_CAPSULE | Freq: Every day | ORAL | Status: DC
  Administered 2019-06-03 – 2019-06-05 (×3): 220 mg via ORAL
  Filled 2019-06-03 (×3): qty 1

## 2019-06-03 MED ORDER — ALBUTEROL SULFATE HFA 108 (90 BASE) MCG/ACT IN AERS
2.0000 | INHALATION_SPRAY | Freq: Four times a day (QID) | RESPIRATORY_TRACT | Status: DC
  Administered 2019-06-03 – 2019-06-05 (×8): 2 via RESPIRATORY_TRACT
  Filled 2019-06-03: qty 6.7

## 2019-06-03 MED ORDER — METHYLPREDNISOLONE SODIUM SUCC 125 MG IJ SOLR
60.0000 mg | Freq: Two times a day (BID) | INTRAMUSCULAR | Status: DC
  Administered 2019-06-03 (×2): 60 mg via INTRAVENOUS
  Filled 2019-06-03 (×2): qty 2

## 2019-06-03 MED ORDER — GUAIFENESIN-DM 100-10 MG/5ML PO SYRP: 10.0000 mL | ORAL_SOLUTION | ORAL | Status: DC | PRN

## 2019-06-03 MED ORDER — HYDROCOD POLST-CPM POLST ER 10-8 MG/5ML PO SUER: 5.0000 mL | Freq: Two times a day (BID) | ORAL | Status: DC | PRN

## 2019-06-03 MED ORDER — LOPERAMIDE HCL 2 MG PO CAPS: 2.0000 mg | ORAL_CAPSULE | ORAL | Status: DC | PRN

## 2019-06-03 MED ORDER — ASCORBIC ACID 500 MG PO TABS
500.0000 mg | ORAL_TABLET | Freq: Every day | ORAL | Status: DC
  Administered 2019-06-03 – 2019-06-05 (×3): 500 mg via ORAL
  Filled 2019-06-03 (×3): qty 1

## 2019-06-03 MED ORDER — SODIUM CHLORIDE 0.9 % IV SOLN: 200.0000 mg | Freq: Once | INTRAVENOUS | Status: DC

## 2019-06-03 MED ORDER — ASPIRIN EC 81 MG PO TBEC
81.0000 mg | DELAYED_RELEASE_TABLET | Freq: Every day | ORAL | Status: DC
  Administered 2019-06-04 – 2019-06-05 (×2): 81 mg via ORAL
  Filled 2019-06-03 (×3): qty 1

## 2019-06-03 MED ORDER — OMEPRAZOLE MAGNESIUM 20 MG PO TBEC: 20.0000 mg | DELAYED_RELEASE_TABLET | Freq: Two times a day (BID) | ORAL | Status: DC

## 2019-06-03 MED ORDER — SODIUM CHLORIDE 0.9 % IV SOLN: 100.0000 mg | Freq: Every day | INTRAVENOUS | Status: DC

## 2019-06-03 MED ORDER — SUCRALFATE 1 GM/10ML PO SUSP: 1.0000 g | Freq: Four times a day (QID) | ORAL | Status: DC

## 2019-06-03 NOTE — ED Notes (Signed)
[facility] contacted for transport and report given to Emergency planning/management officer at [facility]. Paperwork printed out and placed into patient's folder by nurses station

## 2019-06-03 NOTE — Evaluation (Signed)
Physical Therapy Evaluation & Discharge Patient Details Name: Isaiah Gray MRN: [id number] DOB: 07-13-1955 Today's Date: 06/03/2019   History of Present Illness  Pt is a 64 y.o. male who initially presented to ED 05/30/19 with generalized weakness, treated for dehydration and d/c home, now admitted 06/02/19 with worsening weakness and dyspnea; (+) COVID-19. PMH includes stage III colon CA s/p R hemicolectomy (05/2018) on chemo (most recent tx 05/22/19), HTN, GERD.    Clinical Impression  Patient evaluated by Physical Therapy with no further acute PT needs identified. PTA, pt independent, works and lives with wife. Today, pt independent with transfers, ambulation and ADL tasks. SpO2 down to 85% on RA while walking (via finger probe), quick to return to 90-92% with rest; switched to ear probe and reading up to 99% on RA. Educ re: IS/flutter valve use, positioning, seated/standing therex, activity recommendations and importance of frequent mobility. All education has been completed and the patient has no further questions. Acute PT is signing off. Thank you for this referral.    Follow Up Recommendations No PT follow up    Equipment Recommendations  None recommended by PT    Recommendations for Other Services       Precautions / Restrictions Precautions Precautions: None Restrictions Weight Bearing Restrictions: No      Mobility  Bed Mobility Overal bed mobility: Independent                Transfers Overall transfer level: Independent                  Ambulation/Gait Ambulation/Gait assistance: Independent Gait Distance (Feet): 800 Feet Assistive device: None Gait Pattern/deviations: WFL(Within Functional Limits)   Gait velocity interpretation: >2.62 ft/sec, indicative of community ambulatory General Gait Details: SpO2 down to 85% on RA, unreliable pleth, pt with DOE 2/4; returning to 88-99% on RA with seated rest when switched from finger to ear probe  Stairs             Wheelchair Mobility    Modified Rankin (Stroke Patients Only)       Balance Overall balance assessment: No apparent balance deficits (not formally assessed)                           High level balance activites: Side stepping;Backward walking;Direction changes;Turns;Sudden stops;Head turns High Level Balance Comments: No overt instability or LOB with higher level balance tasks             Pertinent Vitals/Pain Pain Assessment: No/denies pain    Home Living Family/patient expects to be discharged to:: Private residence Living Arrangements: Spouse/significant other Available Help at Discharge: Family;Available 24 hours/day Type of Home: House Home Access: Stairs to enter Entrance Stairs-Rails: None Entrance Stairs-Number of Steps: 1-2 Home Layout: Two level Home Equipment: Shower seat      Prior Function Level of Independence: Independent         Comments: Drives. Works in Aeronautical engineer. Recently started chemo (had 3rd treatment 05/22/19). Has 2 dogs     Hand Dominance        Extremity/Trunk Assessment   Upper Extremity Assessment Upper Extremity Assessment: Overall WFL for tasks assessed    Lower Extremity Assessment Lower Extremity Assessment: Overall WFL for tasks assessed       Communication   Communication: No difficulties  Cognition Arousal/Alertness: Awake/alert Behavior During Therapy: WFL for tasks assessed/performed Overall Cognitive Status: Within Functional Limits for tasks assessed  General Comments General comments (skin integrity, edema, etc.): Educ re: energy conservation, activity recommendations (seated/standing therex options within limits of O2/lines), prone when in bed (pt can tolerate laying on stomach) or upright in recliner majority of day    Exercises     Assessment/Plan    PT Assessment Patent does not need any further PT services  PT  Problem List         PT Treatment Interventions      PT Goals (Current goals can be found in the Care Plan section)  Acute Rehab PT Goals PT Goal Formulation: All assessment and education complete, DC therapy    Frequency     Barriers to discharge        Co-evaluation               AM-PAC PT "6 Clicks" Mobility  Outcome Measure Help needed turning from your back to your side while in a flat bed without using bedrails?: None Help needed moving from lying on your back to sitting on the side of a flat bed without using bedrails?: None Help needed moving to and from a bed to a chair (including a wheelchair)?: None Help needed standing up from a chair using your arms (e.g., wheelchair or bedside chair)?: None Help needed to walk in hospital room?: None Help needed climbing 3-5 steps with a railing? : None 6 Click Score: 24    End of Session   Activity Tolerance: Patient tolerated treatment well Patient left: in chair;with call bell/phone within reach Nurse Communication: Mobility status PT Visit Diagnosis: Other abnormalities of gait and mobility (R26.89)    Time: [id number] PT Time Calculation (min) (ACUTE ONLY): 30 min   Charges:   PT Evaluation $PT Eval Moderate Complexity: 1 Mod PT Treatments $Therapeutic Exercise: 8-22 mins      Mabeline Caras, PT, DPT Acute Rehabilitation Services  Pager [phone] Office [place] 06/03/2019, 3:13 PM

## 2019-06-03 NOTE — ED Notes (Signed)
Pt. Documented in error see above note in chart. 

## 2019-06-03 NOTE — Progress Notes (Signed)
ANTICOAGULATION CONSULT NOTE - Initial Consult  Pharmacy Consult for Lovenox Indication: VTE prophylaxis  No Known Allergies  Patient Measurements: Height: 5\' 10"  (177.8 cm) Weight: 166 lb 7.2 oz (75.5 kg) IBW/kg (Calculated) : 73 Heparin Dosing Weight: 75.5 kg  Vital Signs: Temp: 98.2 F (36.8 C) (01/25 0554) Temp Source: Oral (01/25 0554) BP: 131/85 (01/25 0554) Pulse Rate: 63 (01/25 0554)  Labs: Recent Labs    06/02/19 2141 06/03/19 0855  HGB 12.1* 14.6  HCT 34.9* 42.5  PLT 232 316  CREATININE 1.07 1.07    Estimated Creatinine Clearance: 73.9 mL/min (by C-G formula based on SCr of 1.07 mg/dL).   Medical History: Past Medical History:  Diagnosis Date  . Cataract 2015   right ecl with lens inplant  . GERD (gastroesophageal reflux disease)   . HTN (hypertension)   . Primary cancer of cecum ([place]) dx'Gray 01/25/19    Medications:  Scheduled:  . albuterol  2 puff Inhalation Q6H  . amLODipine  10 mg Oral Daily  . vitamin C  500 mg Oral Daily  . aspirin EC  81 mg Oral Daily  . enoxaparin (LOVENOX) injection  40 mg Subcutaneous Q24H  . Ipratropium-Albuterol  2 puff Inhalation Q6H  . losartan  50 mg Oral Daily  . methylPREDNISolone (SOLU-MEDROL) injection  60 mg Intravenous Q12H  . pantoprazole  40 mg Oral Daily  . zinc sulfate  220 mg Oral Daily    Assessment: 64 y/o M w/ a h/o colon cancer s/p hemicolectomy on chemo admitted with COVID-19 pneumonitis. Pharmacy consulted for Lovenox for VTE prophylaxis.   Goal of Therapy:  Monitor platelets by anticoagulation protocol: Yes   Plan:  -Agree with Lovenox 40 mg daily -F/U renal function, CBC  Isaiah Gray 06/03/2019,11:44 AM

## 2019-06-03 NOTE — Progress Notes (Signed)
Ambulation Note  Saturation Pre: 95% on 2L  Ambulation Distance: 200 ft  Saturation During Ambulation: 84--91% on 2L  Notes: Pt reported dizziness upon standing, but subsided after standing up for a couple of minutes. Pt walked independently. Tolerated well. Pt's sats dropped to 84% and took about 5-6 minutes for sats to recover into the 90s. Pt returned to recliner with call bell within reach, sats were 90% on 2L.   Philis Kendall, MS, ACSM CEP 12:08 PM 06/03/2019

## 2019-06-03 NOTE — ED Notes (Signed)
[facility] picking up patient and taking them to [facility]

## 2019-06-03 NOTE — Progress Notes (Signed)
PROGRESS NOTE                                                                                                                                                                                                             Patient Demographics:    Isaiah Gray, is a 64 y.o. male, DOB - [birth year], CBI:[id number]  Outpatient Primary MD for the patient is Derrill [facility]., MD    LOS - 1  Admit date - 06/02/2019    Chief Complaint  Patient presents with  . Shortness of Breath       Brief Narrative  Isaiah Gray is a 64 y.o. male with medical history significant of stage III colon cancer status post right hemicolectomy in 1 05/2018.  Other comorbidities include chronic essential hypertension, GERD.  For his stage III cancer, patient had been on chemotherapy up until 01/13.  As per patient, he developed significant diarrhea post chemotherapy with associated generalized weakness,weight loss.  Due to generalized weakness, patient had presented to the emergency room on 05/30/2019 with generalized weakness.  He was treated for dehydration with IV fluids and discharged home.  His Covid test done during his short course in the ED came back positive.  Patient was called and informed about Covid status.  Over the past few days, patient has noted worsening generalized weakness and exertional dyspnea has his presentation   Subjective:    [name] today has, No headache, No chest pain, No abdominal pain - No Nausea, No new weakness tingling or numbness, mild SOB   Assessment  & Plan :    Active Problems:   Pneumonia due to COVID-19 virus   1. Acute Hypoxic Resp. Failure due to Acute Covid 19 Viral Pneumonitis during the ongoing 2020 Covid 19 Pandemic -   Encouraged the patient to sit up in chair in the daytime use I-S and flutter valve for pulmonary toiletry and then prone in bed when at night.  Actemra off label use - patient was told  that if COVID-19 pneumonitis gets worse we might potentially use Actemra off label, patient denies any known history of tuberculosis or hepatitis, understands the risks and benefits and wants to proceed with Actemra treatment if required.  Convalescent plasma  - Patient was also informed in detail about convalescent plasma use, patient has consented for the same.  SpO2: 100 % O2 Flow Rate (L/min): 2 L/min  Recent Labs  Lab 05/30/19 1537 06/02/19 2141 06/03/19 0855  CRP  --  20.2* 25.1*  DDIMER  --  2.02* 1.99*  FERRITIN  --  465*  --   BNP  --   --  49.9  PROCALCITON  --  0.79  --   SARSCOV2NAA POSITIVE*  --   --     Hepatic Function Latest Ref Rng & Units 06/02/2019 05/30/2019 05/21/2019  Total Protein 6.5 - 8.1 g/dL 5.8(L) 6.7 6.2(L)  Albumin 3.5 - 5.0 g/dL 2.4(L) 3.2(L) 3.7  AST 15 - 41 U/L 51(H) 48(H) 74(H)  ALT 0 - 44 U/L 43 43 97(H)  Alk Phosphatase 38 - 126 U/L 102 115 106  Total Bilirubin 0.3 - 1.2 mg/dL 1.3(H) 0.7 0.7    2.  History of colon cancer.  Recent hemicolectomy in November 2020, currently on chemotherapy under the care of Dr. Benay Spice.  3. HTN - stable on Norvasc.    Condition - Fair  Family Communication  :  Wife 06/03/19  Code Status :  Full  Diet :   Diet Order            Diet Heart Room service appropriate? Yes; Fluid consistency: Thin  Diet effective now               Disposition Plan  :  Home in 2-3 days  Consults  :    Procedures  :  None  PUD Prophylaxis :   DVT Prophylaxis  :  Lovenox added  Lab Results  Component Value Date   PLT 316 06/03/2019    Inpatient Medications  Scheduled Meds: . Ipratropium-Albuterol  2 puff Inhalation Q6H  . methylPREDNISolone (SOLU-MEDROL) injection  60 mg Intravenous Q12H   Continuous Infusions: . remdesivir 100 mg in NS 100 mL 100 mg (06/03/19 1024)   PRN Meds:.  Antibiotics  :    Anti-infectives (From admission, onward)   Start     Dose/Rate Route Frequency Ordered Stop   06/03/19  1000  remdesivir 100 mg in sodium chloride 0.9 % 100 mL IVPB     100 mg 200 mL/hr over 30 Minutes Intravenous Daily 06/02/19 2246 06/07/19 0959   06/02/19 2315  remdesivir 200 mg in sodium chloride 0.9% 250 mL IVPB     200 mg 580 mL/hr over 30 Minutes Intravenous Once 06/02/19 2246 06/02/19 2354       Time Spent in minutes  30   Lala Lund M.D on 06/03/2019 at 11:33 AM  To page go to [email or web address] - password [email or web address]  [facility] -  Office  [phone]    See all Orders from today for further details    Objective:   Vitals:   06/03/19 0030 06/03/19 0100 06/03/19 0200 06/03/19 0554  BP: 129/75 (!) 155/84 (!) 158/84 131/85  Pulse: 74 73 80 63  Resp: (!) 26 (!) 27 19 20   Temp:   98 F (36.7 C) 98.2 F (36.8 C)  TempSrc:   Oral Oral  SpO2: 96% 97% 93% 100%  Weight:  75.5 kg    Height:  5' 10"  (1.778 m)      Wt Readings from Last 3 Encounters:  06/03/19 75.5 kg  05/30/19 75.4 kg  05/21/19 82.3 kg     Intake/Output Summary (Last 24 hours) at 06/03/2019 1133 Last data filed at 06/02/2019 2354 Gross per 24 hour  Intake 290 ml  Output --  Net 290 ml  Physical Exam  Awake Alert,   No new F.N deficits, Normal affect [place].AT,PERRAL Supple Neck,No JVD, No cervical lymphadenopathy appriciated.  Symmetrical Chest wall movement, Good air movement bilaterally, CTAB RRR,No Gallops,Rubs or new Murmurs, No Parasternal Heave +ve B.Sounds, Abd Soft, No tenderness, No organomegaly appriciated, No rebound - guarding or rigidity. No Cyanosis, Clubbing or edema, No new Rash or bruise       Data Review:    CBC Recent Labs  Lab 05/30/19 1159 06/02/19 2141 06/03/19 0855  WBC 6.9 10.9* 6.9  HGB 14.0 12.1* 14.6  HCT 40.1 34.9* 42.5  PLT 180 232 316  MCV 89.1 87.7 89.7  MCH 31.1 30.4 30.8  MCHC 34.9 34.7 34.4  RDW 17.4* 17.2* 17.2*  LYMPHSABS 0.9 0.9  --   MONOABS 1.2* 1.7*  --   EOSABS 0.0 0.1  --   BASOSABS 0.0 0.0  --     Chemistries  Recent Labs   Lab 05/30/19 1159 06/02/19 2141 06/03/19 0855  NA 140 134* 138  K 4.1 3.4* 3.5  CL 104 100 99  CO2 24 24 24   GLUCOSE 115* 120* 173*  BUN 20 16 20   CREATININE 1.29* 1.07 1.07  CALCIUM 9.3 8.4* 9.2  MG  --   --  2.3  AST 48* 51*  --   ALT 43 43  --   ALKPHOS 115 102  --   BILITOT 0.7 1.3*  --    ------------------------------------------------------------------------------------------------------------------ Recent Labs    06/02/19 2141  TRIG 89    Lab Results  Component Value Date   HGBA1C 5.1 03/12/2019   ------------------------------------------------------------------------------------------------------------------ No results for input(s): TSH, T4TOTAL, T3FREE, THYROIDAB in the last 72 hours.  Invalid input(s): FREET3  Cardiac Enzymes No results for input(s): CKMB, TROPONINI, MYOGLOBIN in the last 168 hours.  Invalid input(s): CK ------------------------------------------------------------------------------------------------------------------    Component Value Date/Time   BNP 49.9 06/03/2019 0855    Micro Results Recent Results (from the past 240 hour(s))  SARS CORONAVIRUS 2 (TAT 6-24 HRS) Nasopharyngeal Nasopharyngeal Swab     Status: Abnormal   Collection Time: 05/30/19  3:37 PM   Specimen: Nasopharyngeal Swab  Result Value Ref Range Status   SARS Coronavirus 2 POSITIVE (A) NEGATIVE Final    Comment: RESULT CALLED TO, READ BACK BY AND VERIFIED WITH: EMAILED TO [place] AT 2229 ON 05/30/2019 BY SAINVILUS S (NOTE) SARS-CoV-2 target nucleic acids are DETECTED. The SARS-CoV-2 RNA is generally detectable in upper and lower respiratory specimens during the acute phase of infection. Positive results are indicative of the presence of SARS-CoV-2 RNA. Clinical correlation with patient history and other diagnostic information is  necessary to determine patient infection status. Positive results do not rule out bacterial infection or co-infection with other  viruses.  The expected result is Negative. Fact Sheet for Patients: SugarRoll.be Fact Sheet for Healthcare Providers: https://www.woods-mathews.com/ This test is not yet approved or cleared by the [place] FDA and  has been authorized for detection and/or diagnosis of SARS-CoV-2 by FDA under an Emergency Use Authorization (EUA). This EUA will remain  in effect (meaning this t est can be used) for the duration of the COVID-19 declaration under Section 564(b)(1) of the Act, 21 U.S.C. section 360bbb-3(b)(1), unless the authorization is terminated or revoked sooner. Performed at [facility], [place] [address], [place],  [address]   Blood Culture (routine x 2)     Status: None (Preliminary result)   Collection Time: 06/02/19  9:41 PM   Specimen: BLOOD  Result Value Ref Range Status   Specimen Description BLOOD LEFT ANTECUBITAL  Final   Special Requests   Final    BOTTLES DRAWN AEROBIC AND ANAEROBIC Blood Culture results may not be optimal due to an excessive volume of blood received in culture bottles Performed at [facility] [address], [place], [place] [address]    Culture PENDING  Incomplete   Report Status PENDING  Incomplete    Radiology Reports DG Chest Port 1 View  Result Date: 06/02/2019 CLINICAL DATA:  Shortness of breath. EXAM: PORTABLE CHEST 1 VIEW COMPARISON:  June 02, 2019 FINDINGS: Mild hazy infiltrates are seen within the bilateral lung bases and lateral aspect of the mid right lung. There is no evidence of a pleural effusion or pneumothorax. The heart size and mediastinal contours are within normal limits. The visualized skeletal structures are unremarkable. IMPRESSION: 1. Mild, bilateral hazy infiltrates, right greater than left. Electronically Signed   By: Virgina Norfolk M.D.   On: 06/02/2019 21:08   DG Chest Portable 1 View  Result Date: 05/30/2019 CLINICAL DATA:  Cough and  shortness of breath. The patient is undergoing chemotherapy for colon cancer. EXAM: PORTABLE CHEST 1 VIEW COMPARISON:  CT chest, abdomen and pelvis 02/06/2019. FINDINGS: The lungs are clear. Heart size is normal. No pneumothorax or pleural fluid. No acute or focal bony abnormality. IMPRESSION: No acute disease. Electronically Signed   By: Inge Rise M.D.   On: 05/30/2019 14:23

## 2019-06-04 LAB — COMPREHENSIVE METABOLIC PANEL
ALT: 63 U/L — ABNORMAL HIGH (ref 0–44)
AST: 78 U/L — ABNORMAL HIGH (ref 15–41)
Albumin: 2.4 g/dL — ABNORMAL LOW (ref 3.5–5.0)
Alkaline Phosphatase: 119 U/L (ref 38–126)
Anion gap: 11 (ref 5–15)
BUN: 27 mg/dL — ABNORMAL HIGH (ref 8–23)
CO2: 23 mmol/L (ref 22–32)
Calcium: 9 mg/dL (ref 8.9–10.3)
Chloride: 103 mmol/L (ref 98–111)
Creatinine, Ser: 0.93 mg/dL (ref 0.61–1.24)
GFR calc Af Amer: >60 mL/min (ref >60)
GFR calc non Af Amer: >60 mL/min (ref >60)
Glucose, Bld: 179 mg/dL — ABNORMAL HIGH (ref 70–99)
Potassium: 3.7 mmol/L (ref 3.5–5.1)
Sodium: 137 mmol/L (ref 135–145)
Total Bilirubin: 0.7 mg/dL (ref 0.3–1.2)
Total Protein: 6 g/dL — ABNORMAL LOW (ref 6.5–8.1)

## 2019-06-04 LAB — CBC WITH DIFFERENTIAL/PLATELET
Abs Immature Granulocytes: 0.18 10*3/uL — ABNORMAL HIGH (ref 0.00–0.07)
Basophils Absolute: 0 10*3/uL (ref 0.0–0.1)
Basophils Relative: 0 %
Eosinophils Absolute: 0 10*3/uL (ref 0.0–0.5)
Eosinophils Relative: 0 %
HCT: 35 % — ABNORMAL LOW (ref 39.0–52.0)
Hemoglobin: 12.1 g/dL — ABNORMAL LOW (ref 13.0–17.0)
Immature Granulocytes: 1 %
Lymphocytes Relative: 4 %
Lymphs Abs: 0.6 10*3/uL — ABNORMAL LOW (ref 0.7–4.0)
MCH: 30.3 pg (ref 26.0–34.0)
MCHC: 34.6 g/dL (ref 30.0–36.0)
MCV: 87.7 fL (ref 80.0–100.0)
Monocytes Absolute: 1.3 10*3/uL — ABNORMAL HIGH (ref 0.1–1.0)
Monocytes Relative: 8 %
Neutro Abs: 13.5 10*3/uL — ABNORMAL HIGH (ref 1.7–7.7)
Neutrophils Relative %: 87 %
Platelets: 355 10*3/uL (ref 150–400)
RBC: 3.99 MIL/uL — ABNORMAL LOW (ref 4.22–5.81)
RDW: 17.2 % — ABNORMAL HIGH (ref 11.5–15.5)
WBC: 15.6 10*3/uL — ABNORMAL HIGH (ref 4.0–10.5)
nRBC: 0 % (ref 0.0–0.2)

## 2019-06-04 LAB — D-DIMER, QUANTITATIVE: D-Dimer, Quant: 0.85 ug/mL-FEU — ABNORMAL HIGH (ref 0.00–0.50)

## 2019-06-04 LAB — BRAIN NATRIURETIC PEPTIDE: B Natriuretic Peptide: 49 pg/mL (ref 0.0–100.0)

## 2019-06-04 LAB — HIV ANTIBODY (ROUTINE TESTING W REFLEX): HIV Screen 4th Generation wRfx: NONREACTIVE

## 2019-06-04 LAB — MAGNESIUM: Magnesium: 2.3 mg/dL (ref 1.7–2.4)

## 2019-06-04 LAB — C-REACTIVE PROTEIN
CRP: 13.9 mg/dL — ABNORMAL HIGH (ref <1.0)
CRP: 14.2 mg/dL — ABNORMAL HIGH (ref <1.0)

## 2019-06-04 MED ORDER — METHYLPREDNISOLONE SODIUM SUCC 40 MG IJ SOLR
40.0000 mg | Freq: Every day | INTRAMUSCULAR | Status: DC
  Administered 2019-06-04 – 2019-06-05 (×2): 40 mg via INTRAVENOUS
  Filled 2019-06-04 (×2): qty 1

## 2019-06-04 MED ORDER — CALCIUM CARBONATE ANTACID 500 MG PO CHEW
1.0000 | CHEWABLE_TABLET | Freq: Three times a day (TID) | ORAL | Status: DC | PRN
  Administered 2019-06-04 (×2): 200 mg via ORAL
  Filled 2019-06-04 (×2): qty 1

## 2019-06-04 MED ORDER — FUROSEMIDE 10 MG/ML IJ SOLN
40.0000 mg | Freq: Once | INTRAMUSCULAR | Status: AC
  Administered 2019-06-04: 40 mg via INTRAVENOUS
  Filled 2019-06-04: qty 4

## 2019-06-04 NOTE — Progress Notes (Signed)
Ambulation Note  Saturation Pre: 96% on RA  Ambulation Distance: 750 ft  Saturation During Ambulation: 92-95% on RA  Notes: Pt walked independently. Tolerated well. Pt returned to recliner with call bell within reach, sats were 94% on RA.   Philis Kendall, MS, ACSM CEP 9:37 AM 06/04/2019

## 2019-06-04 NOTE — Plan of Care (Signed)
  Problem: Education: Goal: Knowledge of risk factors and measures for prevention of condition will improve Outcome: Progressing   Problem: Coping: Goal: Psychosocial and spiritual needs will be supported Outcome: Progressing   Problem: Respiratory: Goal: Will maintain a patent airway Outcome: Progressing Goal: Complications related to the disease process, condition or treatment will be avoided or minimized Outcome: Progressing   

## 2019-06-04 NOTE — Progress Notes (Signed)
Patient alert and oriented, vital signs obtained and are stable.  He is resting in bed, room air, O2 sats WNL.  Patient complaining of indigestion which he reports is the result of his dinner choice. Night provider informed, relief medication ordered and administered.   Will follow up and assess for relief of patient's symptoms. No other issues reported at this time.

## 2019-06-04 NOTE — Progress Notes (Signed)
PROGRESS NOTE                                                                                                                                                                                                             Patient Demographics:    Isaiah Gray, is a 64 y.o. male, DOB - February 14, 1956, DJS:[id number]  Outpatient Primary MD for the patient is Derrill Center., MD    LOS - 2  Admit date - 06/02/2019    Chief Complaint  Patient presents with  . Shortness of Breath       Brief Narrative  Isaiah Gray is a 64 y.o. male with medical history significant of stage III colon cancer status post right hemicolectomy in 1 05/2018.  Other comorbidities include chronic essential hypertension, GERD.  For his stage III cancer, patient had been on chemotherapy up until 01/13.  As per patient, he developed significant diarrhea post chemotherapy with associated generalized weakness,weight loss.  Due to generalized weakness, patient had presented to the emergency room on 05/30/2019 with generalized weakness.  He was treated for dehydration with IV fluids and discharged home.  His Covid test done during his short course in the ED came back positive.  Patient was called and informed about Covid status.  Over the past few days, patient has noted worsening generalized weakness and exertional dyspnea has his presentation   Subjective:   Patient in bed, appears comfortable, denies any headache, no fever, no chest pain or pressure, no shortness of breath , no abdominal pain. No focal weakness.    Assessment  & Plan :      1. Acute Hypoxic Resp. Failure due to Acute Covid 19 Viral Pneumonitis during the ongoing 2020 Covid 19 Pandemic - he has mild to moderate disease, on IV steroids and remdesivir doing much better, currently 2 L on exertion and room air at rest.  Has a few crackles for which I will give him IV Lasix, if he continues to do well and  remains off of oxygen will try discharging him home with outpatient remdesivir infusions.  Encouraged the patient to sit up in chair in the daytime use I-S and flutter valve for pulmonary toiletry and then prone in bed when at night.     SpO2: 93 % O2 Flow Rate (L/min): 2 L/min  Recent Labs  Lab 05/30/19 1537 06/02/19 2141 06/03/19 0855 06/04/19 0540 06/04/19 0640  CRP  --  20.2* 25.1* 13.9*  14.2*  --   DDIMER  --  2.02* 1.99*  --  0.85*  FERRITIN  --  465*  --   --   --   BNP  --   --  49.9 49.0  --   PROCALCITON  --  0.79  --   --   --   SARSCOV2NAA POSITIVE*  --   --   --   --     Hepatic Function Latest Ref Rng & Units 06/04/2019 06/02/2019 05/30/2019  Total Protein 6.5 - 8.1 g/dL 6.0(L) 5.8(L) 6.7  Albumin 3.5 - 5.0 g/dL 2.4(L) 2.4(L) 3.2(L)  AST 15 - 41 U/L 78(H) 51(H) 48(H)  ALT 0 - 44 U/L 63(H) 43 43  Alk Phosphatase 38 - 126 U/L 119 102 115  Total Bilirubin 0.3 - 1.2 mg/dL 0.7 1.3(H) 0.7    2.  History of colon cancer.  Recent hemicolectomy in November 2020, currently on chemotherapy under the care of Dr. Benay Spice.  3. HTN - stable on Norvasc.    Condition - Fair  Family Communication  :  Wife 06/03/19, called again on 06/04/2019 at 9:40 AM.  No response.  Code Status :  Full  Diet :   Diet Order            Diet Heart Room service appropriate? Yes; Fluid consistency: Thin  Diet effective now               Disposition Plan  :  Home in 2-3 days outpatient remdesivir infusion if stays off of oxygen  Consults  :    Procedures  :  None  PUD Prophylaxis :   DVT Prophylaxis  :  Lovenox added  Lab Results  Component Value Date   PLT 355 06/04/2019    Inpatient Medications  Scheduled Meds: . albuterol  2 puff Inhalation Q6H  . amLODipine  10 mg Oral Daily  . vitamin C  500 mg Oral Daily  . aspirin EC  81 mg Oral Daily  . enoxaparin (LOVENOX) injection  40 mg Subcutaneous Q24H  . Ipratropium-Albuterol  2 puff Inhalation Q6H  . losartan  50 mg  Oral Daily  . methylPREDNISolone (SOLU-MEDROL) injection  40 mg Intravenous Daily  . pantoprazole  40 mg Oral Daily  . zinc sulfate  220 mg Oral Daily   Continuous Infusions: . 0.9 % NaCl with KCl 20 mEq / L    . remdesivir 100 mg in NS 100 mL 100 mg (06/04/19 0928)   PRN Meds:.  Antibiotics  :    Anti-infectives (From admission, onward)   Start     Dose/Rate Route Frequency Ordered Stop   06/04/19 1000  remdesivir 100 mg in sodium chloride 0.9 % 100 mL IVPB  Status:  Discontinued     100 mg 200 mL/hr over 30 Minutes Intravenous Daily 06/03/19 1134 06/03/19 1140   06/03/19 1145  remdesivir 200 mg in sodium chloride 0.9% 250 mL IVPB  Status:  Discontinued     200 mg 580 mL/hr over 30 Minutes Intravenous Once 06/03/19 1134 06/03/19 1140   06/03/19 1000  remdesivir 100 mg in sodium chloride 0.9 % 100 mL IVPB     100 mg 200 mL/hr over 30 Minutes Intravenous Daily 06/02/19 2246 06/07/19 0959   06/02/19 2315  remdesivir 200 mg in sodium chloride 0.9% 250 mL IVPB     200  mg 580 mL/hr over 30 Minutes Intravenous Once 06/02/19 2246 06/02/19 2354       Time Spent in minutes  30   Lala Lund M.D on 06/04/2019 at 9:42 AM  To page go to [email or web address] - password [facility]  [facility] -  Office  [phone]    See all Orders from today for further details    Objective:   Vitals:   06/03/19 0816 06/03/19 1200 06/04/19 0418 06/04/19 0751  BP: (!) 134/92 125/75 140/85 113/88  Pulse: 70 90 91 85  Resp:  (!) 21 18 18   Temp:  98.4 F (36.9 C) 97.8 F (36.6 C) (!) 97.5 F (36.4 C)  TempSrc:   Oral Oral  SpO2:  90% 96% 93%  Weight:      Height:        Wt Readings from Last 3 Encounters:  06/03/19 75.5 kg  05/30/19 75.4 kg  05/21/19 82.3 kg     Intake/Output Summary (Last 24 hours) at 06/04/2019 0942 Last data filed at 06/03/2019 1600 Gross per 24 hour  Intake 100 ml  Output --  Net 100 ml     Physical Exam  Awake Alert,   No new F.N deficits, Normal  affect .AT,PERRAL Supple Neck,No JVD, No cervical lymphadenopathy appriciated.  Symmetrical Chest wall movement, Good air movement bilaterally, few rales RRR,No Gallops, Rubs or new Murmurs, No Parasternal Heave +ve B.Sounds, Abd Soft, No tenderness, No organomegaly appriciated, No rebound - guarding or rigidity. No Cyanosis, Clubbing or edema, No new Rash or bruise       Data Review:    CBC Recent Labs  Lab 05/30/19 1159 06/02/19 2141 06/03/19 0855 06/04/19 0540  WBC 6.9 10.9* 6.9 15.6*  HGB 14.0 12.1* 14.6 12.1*  HCT 40.1 34.9* 42.5 35.0*  PLT 180 232 316 355  MCV 89.1 87.7 89.7 87.7  MCH 31.1 30.4 30.8 30.3  MCHC 34.9 34.7 34.4 34.6  RDW 17.4* 17.2* 17.2* 17.2*  LYMPHSABS 0.9 0.9  --  0.6*  MONOABS 1.2* 1.7*  --  1.3*  EOSABS 0.0 0.1  --  0.0  BASOSABS 0.0 0.0  --  0.0    Chemistries  Recent Labs  Lab 05/30/19 1159 06/02/19 2141 06/03/19 0855 06/04/19 0640  NA 140 134* 138 137  K 4.1 3.4* 3.5 3.7  CL 104 100 99 103  CO2 24 24 24 23   GLUCOSE 115* 120* 173* 179*  BUN 20 16 20  27*  CREATININE 1.29* 1.07 1.07 0.93  CALCIUM 9.3 8.4* 9.2 9.0  MG  --   --  2.3 2.3  AST 48* 51*  --  78*  ALT 43 43  --  63*  ALKPHOS 115 102  --  119  BILITOT 0.7 1.3*  --  0.7   ------------------------------------------------------------------------------------------------------------------ Recent Labs    06/02/19 2141  TRIG 89    Lab Results  Component Value Date   HGBA1C 5.1 03/12/2019   ------------------------------------------------------------------------------------------------------------------ No results for input(s): TSH, T4TOTAL, T3FREE, THYROIDAB in the last 72 hours.  Invalid input(s): FREET3  Cardiac Enzymes No results for input(s): CKMB, TROPONINI, MYOGLOBIN in the last 168 hours.  Invalid input(s): CK ------------------------------------------------------------------------------------------------------------------    Component Value Date/Time    BNP 49.0 06/04/2019 0540    Micro Results Recent Results (from the past 240 hour(s))  SARS CORONAVIRUS 2 (TAT 6-24 HRS) Nasopharyngeal Nasopharyngeal Swab     Status: Abnormal   Collection Time: 05/30/19  3:37 PM   Specimen: Nasopharyngeal Swab  Result Value Ref  Range Status   SARS Coronavirus 2 POSITIVE (A) NEGATIVE Final    Comment: RESULT CALLED TO, READ BACK BY AND VERIFIED WITH: EMAILED TO [place] AT 2229 ON 05/30/2019 BY SAINVILUS S (NOTE) SARS-CoV-2 target nucleic acids are DETECTED. The SARS-CoV-2 RNA is generally detectable in upper and lower respiratory specimens during the acute phase of infection. Positive results are indicative of the presence of SARS-CoV-2 RNA. Clinical correlation with patient history and other diagnostic information is  necessary to determine patient infection status. Positive results do not rule out bacterial infection or co-infection with other viruses.  The expected result is Negative. Fact Sheet for Patients: SugarRoll.be Fact Sheet for Healthcare Providers: https://www.woods-mathews.com/ This test is not yet approved or cleared by the [place] FDA and  has been authorized for detection and/or diagnosis of SARS-CoV-2 by FDA under an Emergency Use Authorization (EUA). This EUA will remain  in effect (meaning this t est can be used) for the duration of the COVID-19 declaration under Section 564(b)(1) of the Act, 21 U.S.C. section 360bbb-3(b)(1), unless the authorization is terminated or revoked sooner. Performed at [facility], [place] [address], [place], [place] [address]   Urine culture     Status: None   Collection Time: 06/02/19  8:51 PM   Specimen: Urine, Random  Result Value Ref Range Status   Specimen Description   Final    URINE, RANDOM Performed at [facility] [address], [place], [place] [address]    Special Requests   Final    NONE Performed at  [facility], [place] [address], [place], [place] [address]    Culture   Final    NO GROWTH Performed at [facility], [place] [address], [place], [place] [address]    Report Status 06/03/2019 FINAL  Final  Blood Culture (routine x 2)     Status: None (Preliminary result)   Collection Time: 06/02/19  9:41 PM   Specimen: BLOOD  Result Value Ref Range Status   Specimen Description BLOOD LEFT ANTECUBITAL  Final   Special Requests   Final    BOTTLES DRAWN AEROBIC AND ANAEROBIC Blood Culture results may not be optimal due to an excessive volume of blood received in culture bottles Performed at [facility], [place] [address] [place], [place] [address]    Culture PENDING  Incomplete   Report Status PENDING  Incomplete    Radiology Reports DG Chest Port 1 View  Result Date: 06/02/2019 CLINICAL DATA:  Shortness of breath. EXAM: PORTABLE CHEST 1 VIEW COMPARISON:  June 02, 2019 FINDINGS: Mild hazy infiltrates are seen within the bilateral lung bases and lateral aspect of the mid right lung. There is no evidence of a pleural effusion or pneumothorax. The heart size and mediastinal contours are within normal limits. The visualized skeletal structures are unremarkable. IMPRESSION: 1. Mild, bilateral hazy infiltrates, right greater than left. Electronically Signed   By: Virgina Norfolk M.D.   On: 06/02/2019 21:08   DG Chest Portable 1 View  Result Date: 05/30/2019 CLINICAL DATA:  Cough and shortness of breath. The patient is undergoing chemotherapy for colon cancer. EXAM: PORTABLE CHEST 1 VIEW COMPARISON:  CT chest, abdomen and pelvis 02/06/2019. FINDINGS: The lungs are clear. Heart size is normal. No pneumothorax or pleural fluid. No acute or focal bony abnormality. IMPRESSION: No acute disease. Electronically Signed   By: Inge Rise M.D.   On: 05/30/2019 14:23

## 2019-06-05 ENCOUNTER — Encounter: Payer: Self-pay | Admitting: *Deleted

## 2019-06-05 LAB — CBC WITH DIFFERENTIAL/PLATELET
Abs Immature Granulocytes: 0.39 10*3/uL — ABNORMAL HIGH (ref 0.00–0.07)
Basophils Absolute: 0 10*3/uL (ref 0.0–0.1)
Basophils Relative: 0 %
Eosinophils Absolute: 0 10*3/uL (ref 0.0–0.5)
Eosinophils Relative: 0 %
HCT: 32.7 % — ABNORMAL LOW (ref 39.0–52.0)
Hemoglobin: 11.4 g/dL — ABNORMAL LOW (ref 13.0–17.0)
Immature Granulocytes: 2 %
Lymphocytes Relative: 4 %
Lymphs Abs: 0.8 10*3/uL (ref 0.7–4.0)
MCH: 30.8 pg (ref 26.0–34.0)
MCHC: 34.9 g/dL (ref 30.0–36.0)
MCV: 88.4 fL (ref 80.0–100.0)
Monocytes Absolute: 2 10*3/uL — ABNORMAL HIGH (ref 0.1–1.0)
Monocytes Relative: 10 %
Neutro Abs: 15.8 10*3/uL — ABNORMAL HIGH (ref 1.7–7.7)
Neutrophils Relative %: 84 %
Platelets: 377 10*3/uL (ref 150–400)
RBC: 3.7 MIL/uL — ABNORMAL LOW (ref 4.22–5.81)
RDW: 17.5 % — ABNORMAL HIGH (ref 11.5–15.5)
WBC: 19 10*3/uL — ABNORMAL HIGH (ref 4.0–10.5)
nRBC: 0 % (ref 0.0–0.2)

## 2019-06-05 LAB — COMPREHENSIVE METABOLIC PANEL
ALT: 55 U/L — ABNORMAL HIGH (ref 0–44)
AST: 48 U/L — ABNORMAL HIGH (ref 15–41)
Albumin: 2.4 g/dL — ABNORMAL LOW (ref 3.5–5.0)
Alkaline Phosphatase: 113 U/L (ref 38–126)
Anion gap: 11 (ref 5–15)
BUN: 32 mg/dL — ABNORMAL HIGH (ref 8–23)
CO2: 25 mmol/L (ref 22–32)
Calcium: 8.7 mg/dL — ABNORMAL LOW (ref 8.9–10.3)
Chloride: 104 mmol/L (ref 98–111)
Creatinine, Ser: 1.1 mg/dL (ref 0.61–1.24)
GFR calc Af Amer: >60 mL/min (ref >60)
GFR calc non Af Amer: >60 mL/min (ref >60)
Glucose, Bld: 129 mg/dL — ABNORMAL HIGH (ref 70–99)
Potassium: 3.5 mmol/L (ref 3.5–5.1)
Sodium: 140 mmol/L (ref 135–145)
Total Bilirubin: 0.7 mg/dL (ref 0.3–1.2)
Total Protein: 5.6 g/dL — ABNORMAL LOW (ref 6.5–8.1)

## 2019-06-05 LAB — BRAIN NATRIURETIC PEPTIDE: B Natriuretic Peptide: 46.9 pg/mL (ref 0.0–100.0)

## 2019-06-05 LAB — MAGNESIUM: Magnesium: 2.3 mg/dL (ref 1.7–2.4)

## 2019-06-05 LAB — C-REACTIVE PROTEIN: CRP: 6.5 mg/dL — ABNORMAL HIGH (ref <1.0)

## 2019-06-05 LAB — D-DIMER, QUANTITATIVE: D-Dimer, Quant: 0.48 ug/mL-FEU (ref 0.00–0.50)

## 2019-06-05 MED ORDER — DEXAMETHASONE 6 MG PO TABS: 6.0000 mg | ORAL_TABLET | Freq: Every day | ORAL | 0 refills | Status: DC

## 2019-06-05 NOTE — Progress Notes (Signed)
Ambulation Note  Saturation Pre: 92% on RA  Ambulation Distance: 700 ft  Saturation During Ambulation: 89-91% on RA  Notes: Pt walked independently. Tolerated well. Pt returned to recliner with call bell within reach, spO2 was 93% on RA.   Philis Kendall, MS, ACSM CEP 9:45 AM 06/05/2019

## 2019-06-05 NOTE — Discharge Instructions (Signed)
You are scheduled for an outpatient infusion of Remdesivir at10:00 AM on Thursday 1/28.  Please report to [facility] at [address]  Drive to the security guard and tell them you are here for an infusion. They will direct you to the front entrance where we will come and get you.  For questions call [phone].  Thanks            Person Under Monitoring Name: Isaiah Gray  Location: [place] [address]   Infection Prevention Recommendations for Individuals Confirmed to have, or Being Evaluated for, 2019 Novel Coronavirus (COVID-19) Infection Who Receive Care at Home  Individuals who are confirmed to have, or are being evaluated for, COVID-19 should follow the prevention steps below until a healthcare provider or local or state health department says they can return to normal activities.  Stay home except to get medical care You should restrict activities outside your home, except for getting medical care. Do not go to work, school, or public areas, and do not use public transportation or taxis.  Call ahead before visiting your doctor Before your medical appointment, call the healthcare provider and tell them that you have, or are being evaluated for, COVID-19 infection. This will help the healthcare provider's office take steps to keep other people from getting infected. Ask your healthcare provider to call the local or state health department.  Monitor your symptoms Seek prompt medical attention if your illness is worsening (e.g., difficulty breathing). Before going to your medical appointment, call the healthcare provider and tell them that you have, or are being evaluated for, COVID-19 infection. Ask your healthcare provider to call the local or state health department.  Wear a facemask You should wear a facemask that covers your nose and mouth when you are in the same room with other people and when you visit a healthcare provider.  People who live with or visit you should also wear a facemask while they are in the same room with you.  Separate yourself from other people in your home As much as possible, you should stay in a different room from other people in your home. Also, you should use a separate bathroom, if available.  Avoid sharing household items You should not share dishes, drinking glasses, cups, eating utensils, towels, bedding, or other items with other people in your home. After using these items, you should wash them thoroughly with soap and water.  Cover your coughs and sneezes Cover your mouth and nose with a tissue when you cough or sneeze, or you can cough or sneeze into your sleeve. Throw used tissues in a lined trash can, and immediately wash your hands with soap and water for at least 20 seconds or use an alcohol-based hand rub.  Wash your [facility] your hands often and thoroughly with soap and water for at least 20 seconds. You can use an alcohol-based hand sanitizer if soap and water are not available and if your hands are not visibly dirty. Avoid touching your eyes, nose, and mouth with unwashed hands.   Prevention Steps for Caregivers and Household Members of Individuals Confirmed to have, or Being Evaluated for, COVID-19 Infection Being Cared for in the Home  If you live with, or provide care at home for, a person confirmed to have, or being evaluated for, COVID-19 infection please follow these guidelines to prevent infection:  Follow healthcare provider's instructions Make sure that you understand and can help the patient follow any  healthcare provider instructions for all care.  Provide for the patient's basic needs You should help the patient with basic needs in the home and provide support for getting groceries, prescriptions, and other personal needs.  Monitor the patient's symptoms If they are getting sicker, call his or her medical provider and tell them that the patient  has, or is being evaluated for, COVID-19 infection. This will help the healthcare provider's office take steps to keep other people from getting infected. Ask the healthcare provider to call the local or state health department.  Limit the number of people who have contact with the patient  If possible, have only one caregiver for the patient.  Other household members should stay in another home or place of residence. If this is not possible, they should stay  in another room, or be separated from the patient as much as possible. Use a separate bathroom, if available.  Restrict visitors who do not have an essential need to be in the home.  Keep older adults, very young children, and other sick people away from the patient Keep older adults, very young children, and those who have compromised immune systems or chronic health conditions away from the patient. This includes people with chronic heart, lung, or kidney conditions, diabetes, and cancer.  Ensure good ventilation Make sure that shared spaces in the home have good air flow, such as from an air conditioner or an opened window, weather permitting.  Wash your hands often  Wash your hands often and thoroughly with soap and water for at least 20 seconds. You can use an alcohol based hand sanitizer if soap and water are not available and if your hands are not visibly dirty.  Avoid touching your eyes, nose, and mouth with unwashed hands.  Use disposable paper towels to dry your hands. If not available, use dedicated cloth towels and replace them when they become wet.  Wear a facemask and gloves  Wear a disposable facemask at all times in the room and gloves when you touch or have contact with the patient's blood, body fluids, and/or secretions or excretions, such as sweat, saliva, sputum, nasal mucus, vomit, urine, or feces.  Ensure the mask fits over your nose and mouth tightly, and do not touch it during use.  Throw out disposable  facemasks and gloves after using them. Do not reuse.  Wash your hands immediately after removing your facemask and gloves.  If your personal clothing becomes contaminated, carefully remove clothing and launder. Wash your hands after handling contaminated clothing.  Place all used disposable facemasks, gloves, and other waste in a lined container before disposing them with other household waste.  Remove gloves and wash your hands immediately after handling these items.  Do not share dishes, glasses, or other household items with the patient  Avoid sharing household items. You should not share dishes, drinking glasses, cups, eating utensils, towels, bedding, or other items with a patient who is confirmed to have, or being evaluated for, COVID-19 infection.  After the person uses these items, you should wash them thoroughly with soap and water.  Wash laundry thoroughly  Immediately remove and wash clothes or bedding that have blood, body fluids, and/or secretions or excretions, such as sweat, saliva, sputum, nasal mucus, vomit, urine, or feces, on them.  Wear gloves when handling laundry from the patient.  Read and follow directions on labels of laundry or clothing items and detergent. In general, wash and dry with the warmest temperatures recommended on the  label.  Clean all areas the individual has used often  Clean all touchable surfaces, such as counters, tabletops, doorknobs, bathroom fixtures, toilets, phones, keyboards, tablets, and bedside tables, every day. Also, clean any surfaces that may have blood, body fluids, and/or secretions or excretions on them.  Wear gloves when cleaning surfaces the patient has come in contact with.  Use a diluted bleach solution (e.g., dilute bleach with 1 part bleach and 10 parts water) or a household disinfectant with a label that says EPA-registered for coronaviruses. To make a bleach solution at home, add 1 tablespoon of bleach to 1 quart (4 cups)  of water. For a larger supply, add  cup of bleach to 1 gallon (16 cups) of water.  Read labels of cleaning products and follow recommendations provided on product labels. Labels contain instructions for safe and effective use of the cleaning product including precautions you should take when applying the product, such as wearing gloves or eye protection and making sure you have good ventilation during use of the product.  Remove gloves and wash hands immediately after cleaning.  Monitor yourself for signs and symptoms of illness Caregivers and household members are considered close contacts, should monitor their health, and will be asked to limit movement outside of the home to the extent possible. Follow the monitoring steps for close contacts listed on the symptom monitoring form.   ? If you have additional questions, contact your local health department or call the epidemiologist on call at [phone] (available 24/7). ? This guidance is subject to change. For the most up-to-date guidance from [facility], please refer to their website: [email or web address]

## 2019-06-05 NOTE — Discharge Summary (Signed)
Isaiah Gray, is a 64 y.o. male  DOB [birth year]  MRN [id number].  Admission date:  06/02/2019  Admitting Physician  Artist Beach, MD  Discharge Date:  06/05/2019   Primary MD  Derrill Center., MD  Recommendations for primary care physician for things to follow:  - check CBC, CMP during next visit.   Admission Diagnosis  Acute respiratory failure with hypoxia (HCC) [J96.01] Sepsis, due to unspecified organism, unspecified whether acute organ dysfunction present ([place]) [A41.9] Pneumonia due to COVID-19 virus [U07.1, J12.82] COVID-19 [U07.1]   Discharge Diagnosis  Acute respiratory failure with hypoxia ([place]) [J96.01] Sepsis, due to unspecified organism, unspecified whether acute organ dysfunction present ([place]) [A41.9] Pneumonia due to COVID-19 virus [U07.1, J12.82] COVID-19 [U07.1]    Active Problems:   Pneumonia due to COVID-19 virus      Past Medical History:  Diagnosis Date  . Cataract 2015   right ecl with lens inplant  . GERD (gastroesophageal reflux disease)   . HTN (hypertension)   . Primary cancer of cecum ([place]) dx'd 01/25/19    Past Surgical History:  Procedure Laterality Date  . ENDOSCOPIC VEIN LASER TREATMENT Bilateral 2020   legs   . INGUINAL HERNIA REPAIR Right    age 81  . LAPAROSCOPIC PARTIAL COLECTOMY Right 03/15/2019   Procedure: LAPAROSCOPIC RIGHT HEMICOLECTOMY;  Surgeon: Ileana Roup, MD;  Location: [facility];  Service: General;  Laterality: Right;  . MENISCUS REPAIR Bilateral   . VARICOSE VEIN SURGERY Right        History of present illness and  Hospital Course:     Kindly see H&P for history of present illness and admission details, please review complete Labs, Consult reports and Test reports for all details in brief  HPI  from the history and physical done on the day of admission 06/02/2019  HPI: Isaiah Gray is a 64 y.o. male with medical history  significant of stage III colon cancer status post right hemicolectomy in 1 05/2018.  Other comorbidities include chronic essential hypertension, GERD.  For his stage III cancer, patient had been on chemotherapy up until 01/13.  As per patient, he developed significant diarrhea post chemotherapy with associated generalized weakness,weight loss.  Due to generalized weakness, patient had presented to the emergency room on 05/30/2019 with generalized weakness.  He was treated for dehydration with IV fluids and discharged home.  His Covid test done during his short course in the ED came back positive.  Patient was called and informed about Covid status.  Over the past few days, patient has noted worsening generalized weakness and exertional dyspnea has his presentation.  On admission, patient was reported to have been hypoxic.  He denied any URI symptoms or cough.  He denied any fever but temperature on admission was 101 Fahrenheit.  He had required oxygen supplementation to maintain sats above 90%.  Currently on 2 L/min with oxygen sats at 96%.  He denies any associated abdominal pain.  His reported diarrhea was clear with no blood or  mucus appreciated.    ED Course: Reported to have been hypoxic on presentation.  Chest x-ray shows bilateral infectious changes consistent with viral pneumonia.  Decadron and remdesivir given in the ED.  Hospital Course   1. Acute Hypoxic Resp. Failure due to Acute Covid 19 Viral Pneumonitis during the ongoing 2020 Covid 19 Pandemic - he has mild to moderate disease, he is with some oxygen requirement initially, treated with IV steroids, and IV remdesivir, he is better, hypoxia has resolved, ambulated on the hallway today with no hypoxia, only minimal dyspnea with significant activity, patient will be able to be discharged today, to continue another 6 days of oral steroids for total of 10 days treatment, as well will have fifth dose of IV remdesivir tomorrow in the infusion clinic,  he was encouraged to take his incentive spirometry, flutter valve, and keep using them.    SpO2: 93 % O2 Flow Rate (L/min): 2 L/min  Last Labs          Recent Labs  Lab 05/30/19 1537 06/02/19 2141 06/03/19 0855 06/04/19 0540 06/04/19 0640  CRP  --  20.2* 25.1* 13.9*  14.2*  --   DDIMER  --  2.02* 1.99*  --  0.85*  FERRITIN  --  465*  --   --   --   BNP  --   --  49.9 49.0  --   PROCALCITON  --  0.79  --   --   --   SARSCOV2NAA POSITIVE*  --   --   --   --       Hepatic Function Latest Ref Rng & Units 06/04/2019 06/02/2019 05/30/2019  Total Protein 6.5 - 8.1 g/dL 6.0(L) 5.8(L) 6.7  Albumin 3.5 - 5.0 g/dL 2.4(L) 2.4(L) 3.2(L)  AST 15 - 41 U/L 78(H) 51(H) 48(H)  ALT 0 - 44 U/L 63(H) 43 43  Alk Phosphatase 38 - 126 U/L 119 102 115  Total Bilirubin 0.3 - 1.2 mg/dL 0.7 1.3(H) 0.7    2.  History of colon cancer.  Recent hemicolectomy in November 2020, currently on chemotherapy under the care of Dr. Benay Spice.  Discussed with oncology, they will see him after his 21 days of quarantine, for now to hold Xeloda as well  3. HTN - stable, continue home medication     Discharge Condition:  [place]   Discharge Instructions  and  Discharge Medications    Discharge Instructions    Discharge instructions   Complete by: As directed    Follow with Primary MD Derrill Center., MD in 16 days   Get CBC, CMP, 2 view Chest X ray checked  by Primary MD next visit.    Activity: As tolerated with Full fall precautions use walker/cane & assistance as needed   Disposition Home    Diet: Heart Healthy   For Heart failure patients - Check your Weight same time everyday, if you gain over 2 pounds, or you develop in leg swelling, experience more shortness of breath or chest pain, call your Primary MD immediately. Follow Cardiac Low Salt Diet and 1.5 lit/day fluid restriction.   On your next visit with your primary care physician please Get Medicines reviewed and adjusted.   Please  request your Prim.MD to go over all Hospital Tests and Procedure/Radiological results at the follow up, please get all Hospital records sent to your Prim MD by signing hospital release before you go home.   If you experience worsening of your admission symptoms, develop shortness of  breath, life threatening emergency, suicidal or homicidal thoughts you must seek medical attention immediately by calling 911 or calling your MD immediately  if symptoms less severe.  You Must read complete instructions/literature along with all the possible adverse reactions/side effects for all the Medicines you take and that have been prescribed to you. Take any new Medicines after you have completely understood and accpet all the possible adverse reactions/side effects.   Do not drive, operating heavy machinery, perform activities at heights, swimming or participation in water activities or provide baby sitting services if your were admitted for syncope or siezures until you have seen by Primary MD or a Neurologist and advised to do so again.  Do not drive when taking Pain medications.    Do not take more than prescribed Pain, Sleep and Anxiety Medications  Special Instructions: If you have smoked or chewed Tobacco  in the last 2 yrs please stop smoking, stop any regular Alcohol  and or any Recreational drug use.  Wear Seat belts while driving.   Please note  You were cared for by a hospitalist during your hospital stay. If you have any questions about your discharge medications or the care you received while you were in the hospital after you are discharged, you can call the unit and asked to speak with the hospitalist on call if the hospitalist that took care of you is not available. Once you are discharged, your primary care physician will handle any further medical issues. Please note that NO REFILLS for any discharge medications will be authorized once you are discharged, as it is imperative that you return  to your primary care physician (or establish a relationship with a primary care physician if you do not have one) for your aftercare needs so that they can reassess your need for medications and monitor your lab values.   Increase activity slowly   Complete by: As directed      Allergies as of 06/05/2019   No Known Allergies     Medication List    STOP taking these medications   capecitabine 500 MG tablet Commonly known as: XELODA     TAKE these medications   aspirin EC 81 MG tablet Take 81 mg by mouth daily.   dexamethasone 6 MG tablet Commonly known as: DECADRON Take 1 tablet (6 mg total) by mouth daily. Start taking on: June 06, 2019   diphenoxylate-atropine 2.5-0.025 MG tablet Commonly known as: LOMOTIL Take 2 tablets by mouth 4 (four) times daily as needed for diarrhea or loose stools (maximum 8/day).   IMODIUM A-D PO Take 2 tablets by mouth every 8 (eight) hours as needed (diarrhea).   losartan 50 MG tablet Commonly known as: COZAAR Take 50 mg by mouth daily.   omeprazole 20 MG tablet Commonly known as: PRILOSEC OTC Take 1 tablet (20 mg total) by mouth 2 (two) times daily. What changed:   when to take this  reasons to take this   ondansetron 8 MG tablet Commonly known as: ZOFRAN Take 1 tablet (8 mg total) by mouth every 8 (eight) hours as needed for nausea or vomiting. Start 72 hours after IV chemo treatment   prochlorperazine 10 MG tablet Commonly known as: COMPAZINE Take 1 tablet (10 mg total) by mouth every 6 (six) hours as needed. What changed: reasons to take this   RED YEAST RICE PO Take 2 capsules by mouth 2 (two) times daily.   sildenafil 20 MG tablet Commonly known as: REVATIO Take 20 mg  by mouth daily as needed (ED).   sucralfate 1 GM/10ML suspension Commonly known as: CARAFATE Take 10 mLs (1 g total) by mouth every 6 (six) hours as needed.         Diet and Activity recommendation: See Discharge Instructions above   Consults  obtained -  none   Major procedures and Radiology Reports - PLEASE review detailed and final reports for all details, in brief -     DG Chest Port 1 View  Result Date: 06/02/2019 CLINICAL DATA:  Shortness of breath. EXAM: PORTABLE CHEST 1 VIEW COMPARISON:  June 02, 2019 FINDINGS: Mild hazy infiltrates are seen within the bilateral lung bases and lateral aspect of the mid right lung. There is no evidence of a pleural effusion or pneumothorax. The heart size and mediastinal contours are within normal limits. The visualized skeletal structures are unremarkable. IMPRESSION: 1. Mild, bilateral hazy infiltrates, right greater than left. Electronically Signed   By: Virgina Norfolk M.D.   On: 06/02/2019 21:08   DG Chest Portable 1 View  Result Date: 05/30/2019 CLINICAL DATA:  Cough and shortness of breath. The patient is undergoing chemotherapy for colon cancer. EXAM: PORTABLE CHEST 1 VIEW COMPARISON:  CT chest, abdomen and pelvis 02/06/2019. FINDINGS: The lungs are clear. Heart size is normal. No pneumothorax or pleural fluid. No acute or focal bony abnormality. IMPRESSION: No acute disease. Electronically Signed   By: Inge Rise M.D.   On: 05/30/2019 14:23    Micro Results    Recent Results (from the past 240 hour(s))  SARS CORONAVIRUS 2 (TAT 6-24 HRS) Nasopharyngeal Nasopharyngeal Swab     Status: Abnormal   Collection Time: 05/30/19  3:37 PM   Specimen: Nasopharyngeal Swab  Result Value Ref Range Status   SARS Coronavirus 2 POSITIVE (A) NEGATIVE Final    Comment: RESULT CALLED TO, READ BACK BY AND VERIFIED WITH: EMAILED TO [place] AT 2229 ON 05/30/2019 BY SAINVILUS S (NOTE) SARS-CoV-2 target nucleic acids are DETECTED. The SARS-CoV-2 RNA is generally detectable in upper and lower respiratory specimens during the acute phase of infection. Positive results are indicative of the presence of SARS-CoV-2 RNA. Clinical correlation with patient history and other diagnostic  information is  necessary to determine patient infection status. Positive results do not rule out bacterial infection or co-infection with other viruses.  The expected result is Negative. Fact Sheet for Patients: SugarRoll.be Fact Sheet for Healthcare Providers: https://www.woods-mathews.com/ This test is not yet approved or cleared by the [place] FDA and  has been authorized for detection and/or diagnosis of SARS-CoV-2 by FDA under an Emergency Use Authorization (EUA). This EUA will remain  in effect (meaning this t est can be used) for the duration of the COVID-19 declaration under Section 564(b)(1) of the Act, 21 U.S.C. section 360bbb-3(b)(1), unless the authorization is terminated or revoked sooner. Performed at [facility], [place] [address] [place], [place] [address]   Urine culture     Status: None   Collection Time: 06/02/19  8:51 PM   Specimen: Urine, Random  Result Value Ref Range Status   Specimen Description   Final    URINE, RANDOM Performed at [facility] [address], [place], [place] [address]    Special Requests   Final    NONE Performed at [facility], [place] [address], [place], [place] [address]    Culture   Final    NO GROWTH Performed at [facility], [place]  [address] [place], [place] [address]    Report Status 06/03/2019 FINAL  Final  Blood Culture (routine x 2)     Status: None (Preliminary result)   Collection Time: 06/02/19  9:41 PM   Specimen: BLOOD  Result Value Ref Range Status   Specimen Description BLOOD LEFT ANTECUBITAL  Final   Special Requests   Final    BOTTLES DRAWN AEROBIC AND ANAEROBIC Blood Culture results may not be optimal due to an excessive volume of blood received in culture bottles Performed at [facility], [place] [address], [place], [place] [address]    Culture   Final    NO GROWTH 1 DAY Performed at [facility], [place] [address], [place], [place] [address]    Report Status PENDING  Incomplete  Blood Culture (routine x 2)     Status: None (Preliminary result)   Collection Time: 06/02/19 10:30 PM   Specimen: BLOOD  Result Value Ref Range Status   Specimen Description   Final    BLOOD LEFT HAND Performed at [facility] [address] [place], [place] [address]    Special Requests   Final    BOTTLES DRAWN AEROBIC AND ANAEROBIC Blood Culture results may not be optimal due to an excessive volume of blood received in culture bottles Performed at [facility] [address], [place], [place] [address]    Culture   Final    NO GROWTH 1 DAY Performed at [facility], [place] [address] [place], [place] [address]    Report Status PENDING  Incomplete       Today   Subjective:   Isaiah Gray today has no headache,no chest or  abdominal pain, ambulated on the hallway today with no hypoxia, reports diarrhea has resolved. Objective:   Blood pressure 110/72, pulse (!) 105, temperature 98.2 F (36.8 C), temperature source Oral, resp. rate 20, height 5' 10"  (1.778 m), weight 75.5 kg, SpO2 91 %.  No intake or output data in the 24 hours ending 06/05/19 1102  Exam Awake Alert, Oriented x 3, No new F.N deficits, Normal affect Symmetrical Chest wall movement, Good air movement bilaterally, CTAB RRR,No Gallops,Rubs or new Murmurs, No Parasternal Heave +ve B.Sounds, Abd Soft, Non tender, No organomegaly appriciated, No rebound -guarding or rigidity. No Cyanosis, Clubbing or edema, No new Rash or bruise  Data Review   CBC w Diff:  Lab Results  Component Value Date   WBC 19.0 (H) 06/05/2019   HGB 11.4 (L) 06/05/2019   HGB 14.0 05/30/2019   HCT 32.7 (L) 06/05/2019   PLT 377 06/05/2019   PLT 180 05/30/2019   LYMPHOPCT 4 06/05/2019   MONOPCT 10 06/05/2019   EOSPCT 0 06/05/2019   BASOPCT 0 06/05/2019    CMP:  Lab Results  Component Value  Date   NA 140 06/05/2019   K 3.5 06/05/2019   CL 104 06/05/2019   CO2 25 06/05/2019   BUN 32 (H) 06/05/2019   CREATININE 1.10 06/05/2019   CREATININE 1.29 (H) 05/30/2019   PROT 5.6 (L) 06/05/2019   ALBUMIN 2.4 (L) 06/05/2019   BILITOT 0.7 06/05/2019   BILITOT 0.7 05/30/2019   ALKPHOS 113 06/05/2019   AST 48 (H) 06/05/2019   AST 48 (H) 05/30/2019   ALT 55 (H) 06/05/2019   ALT 43 05/30/2019  .   Total Time in preparing paper work, data evaluation and todays exam - 54 minutes  Phillips Climes M.D on 06/05/2019 at  11:02 AM  [facility]   Office  [phone]

## 2019-06-05 NOTE — Progress Notes (Signed)
At request of Dr. Benay Spice: Scheduling message sent to reschedule lab/OV/CAPEOX on 06/21/19 (21 days past + covid result).

## 2019-06-05 NOTE — Progress Notes (Signed)
Patient scheduled for outpatient Remdesivir infusion at 10:00 AM on Thursday 1/28.  Please advise them to report to [facility] at [address]  Drive to the security guard and tell them you are here for an infusion. They will direct you to the front entrance where we will come and get you.  For questions call [phone].  Thanks

## 2019-06-06 ENCOUNTER — Ambulatory Visit (HOSPITAL_COMMUNITY)
Admission: RE | Admit: 2019-06-06 | Discharge: 2019-06-06 | Disposition: A | Discharge status: Home / Self Care | Payer: BC Managed Care – PPO | Source: Ambulatory Visit | Attending: Pulmonary Disease | Admitting: Pulmonary Disease

## 2019-06-06 DIAGNOSIS — U071 COVID-19: Secondary | ICD-10-CM | POA: Diagnosis not present

## 2019-06-06 DIAGNOSIS — J1289 Other viral pneumonia: Secondary | ICD-10-CM | POA: Insufficient documentation

## 2019-06-06 MED ORDER — FAMOTIDINE IN NACL 20-0.9 MG/50ML-% IV SOLN: 20.0000 mg | Freq: Once | INTRAVENOUS | Status: DC | PRN

## 2019-06-06 MED ORDER — SODIUM CHLORIDE 0.9 % IV SOLN: INTRAVENOUS | Status: DC | PRN

## 2019-06-06 MED ORDER — DIPHENHYDRAMINE HCL 50 MG/ML IJ SOLN: 50.0000 mg | Freq: Once | INTRAMUSCULAR | Status: DC | PRN

## 2019-06-06 MED ORDER — EPINEPHRINE 0.3 MG/0.3ML IJ SOAJ: 0.3000 mg | Freq: Once | INTRAMUSCULAR | Status: DC | PRN

## 2019-06-06 MED ORDER — METHYLPREDNISOLONE SODIUM SUCC 125 MG IJ SOLR: 125.0000 mg | Freq: Once | INTRAMUSCULAR | Status: DC | PRN

## 2019-06-06 MED ORDER — ALBUTEROL SULFATE HFA 108 (90 BASE) MCG/ACT IN AERS: 2.0000 | INHALATION_SPRAY | Freq: Once | RESPIRATORY_TRACT | Status: DC | PRN

## 2019-06-06 MED ORDER — SODIUM CHLORIDE 0.9 % IV SOLN
100.0000 mg | Freq: Once | INTRAVENOUS | Status: AC
  Administered 2019-06-06: 100 mg via INTRAVENOUS

## 2019-06-06 MED ORDER — SODIUM CHLORIDE 0.9 % IV SOLN
INTRAVENOUS | Status: AC
  Filled 2019-06-06: qty 20

## 2019-06-06 NOTE — Progress Notes (Signed)
  Diagnosis: COVID-19  Physician: Joya Gaskins  Procedure: Covid Infusion Clinic Med: remdesivir infusion.  Complications: No immediate complications noted.  Discharge: Discharged home   Harriett Sine 06/06/2019

## 2019-06-06 NOTE — Progress Notes (Signed)
  Diagnosis: COVID-19  Physician: Asencion Noble  Procedure: Covid Infusion Clinic Med: remdesivir infusion.  Complications: No immediate complications noted.  Discharge: Discharged home   [place], [place] C 06/06/2019

## 2019-06-08 LAB — CULTURE, BLOOD (ROUTINE X 2)
Culture: NO GROWTH
Culture: NO GROWTH

## 2019-06-10 ENCOUNTER — Telehealth: Payer: Self-pay | Admitting: *Deleted

## 2019-06-10 ENCOUNTER — Telehealth: Payer: Self-pay | Admitting: Oncology

## 2019-06-10 NOTE — Telephone Encounter (Signed)
Scheduled appt per 1/27 sch message - pt is aware of appt date and time   

## 2019-06-10 NOTE — Telephone Encounter (Signed)
Called to inquire when his last chemo appointment is: Provided 06/21/19, which is 21 days after his diagnosis of COVID. He is asking if safe to go into his office 14 days later at ~ 7pm when no one is there? Other staff do have access to his phone and computer when he is not there> Informed him it is best to wait 21 days from diagnosis before going back to the office--he is still potentially contagious.

## 2019-06-11 ENCOUNTER — Ambulatory Visit: Payer: BC Managed Care – PPO | Admitting: Oncology

## 2019-06-11 ENCOUNTER — Other Ambulatory Visit: Payer: BC Managed Care – PPO

## 2019-06-12 ENCOUNTER — Ambulatory Visit: Payer: BC Managed Care – PPO

## 2019-06-21 ENCOUNTER — Inpatient Hospital Stay: Payer: BC Managed Care – PPO | Attending: Oncology

## 2019-06-21 ENCOUNTER — Inpatient Hospital Stay (HOSPITAL_BASED_OUTPATIENT_CLINIC_OR_DEPARTMENT_OTHER): Payer: BC Managed Care – PPO | Admitting: Oncology

## 2019-06-21 ENCOUNTER — Inpatient Hospital Stay: Payer: BC Managed Care – PPO

## 2019-06-21 ENCOUNTER — Other Ambulatory Visit: Payer: Self-pay

## 2019-06-21 VITALS — BP 142/87 | HR 106 | Temp 98.4°F | Resp 17 | Wt 173.2 lb

## 2019-06-21 VITALS — HR 82

## 2019-06-21 DIAGNOSIS — C180 Malignant neoplasm of cecum: Secondary | ICD-10-CM | POA: Insufficient documentation

## 2019-06-21 DIAGNOSIS — T451X5D Adverse effect of antineoplastic and immunosuppressive drugs, subsequent encounter: Secondary | ICD-10-CM | POA: Insufficient documentation

## 2019-06-21 DIAGNOSIS — K521 Toxic gastroenteritis and colitis: Secondary | ICD-10-CM | POA: Diagnosis not present

## 2019-06-21 DIAGNOSIS — Z5111 Encounter for antineoplastic chemotherapy: Secondary | ICD-10-CM | POA: Insufficient documentation

## 2019-06-21 DIAGNOSIS — Z8616 Personal history of COVID-19: Secondary | ICD-10-CM | POA: Diagnosis not present

## 2019-06-21 LAB — CBC WITH DIFFERENTIAL (CANCER CENTER ONLY)
Abs Immature Granulocytes: 0.03 10*3/uL (ref 0.00–0.07)
Basophils Absolute: 0 10*3/uL (ref 0.0–0.1)
Basophils Relative: 0 %
Eosinophils Absolute: 0.1 10*3/uL (ref 0.0–0.5)
Eosinophils Relative: 1 %
HCT: 39.8 % (ref 39.0–52.0)
Hemoglobin: 13.1 g/dL (ref 13.0–17.0)
Immature Granulocytes: 0 %
Lymphocytes Relative: 19 %
Lymphs Abs: 1.3 10*3/uL (ref 0.7–4.0)
MCH: 30.1 pg (ref 26.0–34.0)
MCHC: 32.9 g/dL (ref 30.0–36.0)
MCV: 91.5 fL (ref 80.0–100.0)
Monocytes Absolute: 0.8 10*3/uL (ref 0.1–1.0)
Monocytes Relative: 12 %
Neutro Abs: 4.7 10*3/uL (ref 1.7–7.7)
Neutrophils Relative %: 68 %
Platelet Count: 141 10*3/uL — ABNORMAL LOW (ref 150–400)
RBC: 4.35 MIL/uL (ref 4.22–5.81)
RDW: 17.7 % — ABNORMAL HIGH (ref 11.5–15.5)
WBC Count: 6.9 10*3/uL (ref 4.0–10.5)
nRBC: 0 % (ref 0.0–0.2)

## 2019-06-21 LAB — CMP (CANCER CENTER ONLY)
ALT: 34 U/L (ref 0–44)
AST: 23 U/L (ref 15–41)
Albumin: 2.9 g/dL — ABNORMAL LOW (ref 3.5–5.0)
Alkaline Phosphatase: 113 U/L (ref 38–126)
Anion gap: 7 (ref 5–15)
BUN: 18 mg/dL (ref 8–23)
CO2: 23 mmol/L (ref 22–32)
Calcium: 8.9 mg/dL (ref 8.9–10.3)
Chloride: 110 mmol/L (ref 98–111)
Creatinine: 0.94 mg/dL (ref 0.61–1.24)
GFR, Est AFR Am: >60 mL/min (ref >60)
GFR, Estimated: >60 mL/min (ref >60)
Glucose, Bld: 127 mg/dL — ABNORMAL HIGH (ref 70–99)
Potassium: 4.1 mmol/L (ref 3.5–5.1)
Sodium: 140 mmol/L (ref 135–145)
Total Bilirubin: 0.8 mg/dL (ref 0.3–1.2)
Total Protein: 6.5 g/dL (ref 6.5–8.1)

## 2019-06-21 MED ORDER — LOSARTAN POTASSIUM 50 MG PO TABS: 50.0000 mg | ORAL_TABLET | Freq: Every day | ORAL | 1 refills | Status: DC

## 2019-06-21 MED ORDER — DEXTROSE 5 % IV SOLN
INTRAVENOUS | Status: DC
  Filled 2019-06-21: qty 250

## 2019-06-21 MED ORDER — DEXAMETHASONE SODIUM PHOSPHATE 10 MG/ML IJ SOLN
INTRAMUSCULAR | Status: AC
  Filled 2019-06-21: qty 1

## 2019-06-21 MED ORDER — PALONOSETRON HCL INJECTION 0.25 MG/5ML
0.2500 mg | Freq: Once | INTRAVENOUS | Status: AC
  Administered 2019-06-21: 0.25 mg via INTRAVENOUS

## 2019-06-21 MED ORDER — PALONOSETRON HCL INJECTION 0.25 MG/5ML
INTRAVENOUS | Status: AC
  Filled 2019-06-21: qty 5

## 2019-06-21 MED ORDER — DEXAMETHASONE SODIUM PHOSPHATE 10 MG/ML IJ SOLN
10.0000 mg | Freq: Once | INTRAMUSCULAR | Status: AC
  Administered 2019-06-21: 10 mg via INTRAVENOUS

## 2019-06-21 MED ORDER — OXALIPLATIN CHEMO INJECTION 100 MG/20ML
128.0000 mg/m2 | Freq: Once | INTRAVENOUS | Status: AC
  Administered 2019-06-21: 13:00:00 250 mg via INTRAVENOUS
  Filled 2019-06-21: qty 50

## 2019-06-21 NOTE — Progress Notes (Signed)
[facility] OFFICE PROGRESS NOTE   Diagnosis: Colon cancer  INTERVAL HISTORY:   Isaiah Gray was diagnosed with COVID-19 infection when we saw him on 05/30/2019.  He was admitted on 06/02/2019 when he developed increased shortness of breath and weakness.  He was treated with steroids and remdesivir.  Hypoxia resolved and he was discharged home on 06/05/2019.  He completed an outpatient dose of remdesivir and steroids.  Isaiah Gray reports feeling much better.  He continues to have a cough and mild exertional dyspnea.  He is able to do work at home and go up and down stairs.  Good appetite.  He has frequent soft bowel movements.  No fever.  The feet are peeling and the pain has resolved.  No neuropathy symptoms.  He began cycle 4 capecitabine earlier today and would like to proceed with planned cycle of CAPOX.  Isaiah Gray reports his wife was also diagnosed with COVID-19 infection.  Objective:  Vital signs in last 24 hours:  Blood pressure (!) 142/87, pulse (!) 106, temperature 98.4 F (36.9 C), temperature source Temporal, resp. rate 17, weight 173 lb 3.2 oz (78.6 kg), SpO2 99 %.    Resp: Scattered end inspiratory rhonchi at the posterior chest, no respiratory distress Cardio: Regular rate and rhythm GI: No hepatosplenomegaly, nontender Vascular: No leg edema  Skin: Palms without erythema, mild aortic and superficial desquamation at the soles    Lab Results:  Lab Results  Component Value Date   WBC 6.9 06/21/2019   HGB 13.1 06/21/2019   HCT 39.8 06/21/2019   MCV 91.5 06/21/2019   PLT 141 (L) 06/21/2019   NEUTROABS 4.7 06/21/2019    CMP  Lab Results  Component Value Date   NA 140 06/21/2019   K 4.1 06/21/2019   CL 110 06/21/2019   CO2 23 06/21/2019   GLUCOSE 127 (H) 06/21/2019   BUN 18 06/21/2019   CREATININE 0.94 06/21/2019   CALCIUM 8.9 06/21/2019   PROT 6.5 06/21/2019   ALBUMIN 2.9 (L) 06/21/2019   AST 23 06/21/2019   ALT 34 06/21/2019   ALKPHOS 113 06/21/2019   BILITOT 0.8 06/21/2019   GFRNONAA >60 06/21/2019   GFRAA >60 06/21/2019    Lab Results  Component Value Date   CEA1 1.14 04/05/2019     Medications: I have reviewed the patient's current medications.   Assessment/Plan: 1. Adenocarcinoma of the cecum, stage IIIb (T3N1B)   Right hemicolectomy 03/15/2019 moderate-poorly differentiated, lymphovascular invasion present, 2/17 lymph nodes positive, no loss of mismatch repair protein expression  Colonoscopy 01/25/2019-cecal mass, multiple polyps including the ascending, transverse, and sigmoid colon, pathology from the cecum-adenocarcinoma, pathology from the transverse and sigmoid polyps-tubular adenomas  Tubular adenoma at the proximal ascending colon on the right colectomy specimen  CTs 02/06/2019-mass at the medial wall of the cecum, 2 small ileocolic nodes measuring 5 mm, 4 mm irregular subpleural nodule in the anterior right middle lobe-favored to be a benign subpleural lymph node  Elevated preoperative CEA, normal 04/05/2019  Cycle 1 CAPOX 04/10/2019  Cycle 2 CAPOX 05/01/2019  Cycle 3 CAPOX 05/22/2019, Xeloda dose reduced secondary to hand/foot syndrome and diarrhea, Xeloda discontinued 05/28/2019 secondary to diarrhea and COVID-19 infection  Cycle 4 CAPOX 06/21/2019 2. Gastroesophageal reflux 3. Hypertension 4.   Bilateral inguinal hernias on CT 02/06/19 5.   Family history of colon cancer 6.   Multiple tubular adenomas on the colonoscopy 01/25/2019 and a tubular adenoma on the right colon resection specimen 7.   Elevated liver  enzymes-likely secondary to oxaliplatin 8.  Diarrhea, cough, dehydration 05/30/2019-referred to the emergency room and diagnosed with COVID-19 infection, admitted 06/02/2019 completed course of remdesivir and steroids   Disposition: Isaiah Gray has completed 3 cycles of adjuvant chemotherapy.  Cycle 3 capecitabine was discontinued prematurely secondary to diarrhea and COVID-19  infection.  He was treated for COVID-19 last month.  He has experienced significant clinical provement over the past several weeks and would like to proceed with cycle 4 CAPOX today.  He will continue capecitabine at the current dose.  Isaiah Gray will return for an office and lab visit in 2 weeks.  He will contact us for diarrhea new symptoms.    Betsy Coder, MD  06/21/2019  2:17 PM

## 2019-06-21 NOTE — Patient Instructions (Addendum)
Your Losartan was called in to your pharmacy today as requested. [facility] Discharge Instructions for Patients Receiving Chemotherapy  Today you received the following chemotherapy agents Oxaliplatin.  To help prevent nausea and vomiting after your treatment, we encourage you to take your nausea medication as directed BUT NO ZOFRAN FOR 3 DAYS AFTER CHEMO.   If you develop nausea and vomiting that is not controlled by your nausea medication, call the clinic.   BELOW ARE SYMPTOMS THAT SHOULD BE REPORTED IMMEDIATELY:  *FEVER GREATER THAN 100.5 F  *CHILLS WITH OR WITHOUT FEVER  NAUSEA AND VOMITING THAT IS NOT CONTROLLED WITH YOUR NAUSEA MEDICATION  *UNUSUAL SHORTNESS OF BREATH  *UNUSUAL BRUISING OR BLEEDING  TENDERNESS IN MOUTH AND THROAT WITH OR WITHOUT PRESENCE OF ULCERS  *URINARY PROBLEMS  *BOWEL PROBLEMS  UNUSUAL RASH Items with * indicate a potential emergency and should be followed up as soon as possible.  Feel free to call the clinic you have any questions or concerns. The clinic phone number is [phone].  Please show the [place] at check-in to the Emergency Department and triage nurse.

## 2019-06-22 ENCOUNTER — Emergency Department (HOSPITAL_COMMUNITY): Payer: BC Managed Care – PPO

## 2019-06-22 ENCOUNTER — Emergency Department (HOSPITAL_COMMUNITY)
Admission: EM | Admit: 2019-06-22 | Discharge: 2019-06-22 | Disposition: A | Discharge status: Home / Self Care | Payer: BC Managed Care – PPO | Attending: Emergency Medicine | Admitting: Emergency Medicine

## 2019-06-22 ENCOUNTER — Other Ambulatory Visit: Payer: Self-pay

## 2019-06-22 ENCOUNTER — Encounter (HOSPITAL_COMMUNITY): Payer: Self-pay

## 2019-06-22 DIAGNOSIS — Z8616 Personal history of COVID-19: Secondary | ICD-10-CM | POA: Diagnosis not present

## 2019-06-22 DIAGNOSIS — R1012 Left upper quadrant pain: Secondary | ICD-10-CM | POA: Diagnosis present

## 2019-06-22 DIAGNOSIS — C189 Malignant neoplasm of colon, unspecified: Secondary | ICD-10-CM | POA: Diagnosis not present

## 2019-06-22 DIAGNOSIS — I10 Essential (primary) hypertension: Secondary | ICD-10-CM | POA: Insufficient documentation

## 2019-06-22 DIAGNOSIS — K219 Gastro-esophageal reflux disease without esophagitis: Secondary | ICD-10-CM | POA: Insufficient documentation

## 2019-06-22 DIAGNOSIS — Z79899 Other long term (current) drug therapy: Secondary | ICD-10-CM | POA: Insufficient documentation

## 2019-06-22 DIAGNOSIS — R0789 Other chest pain: Secondary | ICD-10-CM | POA: Diagnosis not present

## 2019-06-22 LAB — COMPREHENSIVE METABOLIC PANEL
ALT: 45 U/L — ABNORMAL HIGH (ref 0–44)
AST: 37 U/L (ref 15–41)
Albumin: 3.4 g/dL — ABNORMAL LOW (ref 3.5–5.0)
Alkaline Phosphatase: 119 U/L (ref 38–126)
Anion gap: 11 (ref 5–15)
BUN: 24 mg/dL — ABNORMAL HIGH (ref 8–23)
CO2: 22 mmol/L (ref 22–32)
Calcium: 9.3 mg/dL (ref 8.9–10.3)
Chloride: 107 mmol/L (ref 98–111)
Creatinine, Ser: 0.9 mg/dL (ref 0.61–1.24)
GFR calc Af Amer: >60 mL/min (ref >60)
GFR calc non Af Amer: >60 mL/min (ref >60)
Glucose, Bld: 111 mg/dL — ABNORMAL HIGH (ref 70–99)
Potassium: 4 mmol/L (ref 3.5–5.1)
Sodium: 140 mmol/L (ref 135–145)
Total Bilirubin: 0.9 mg/dL (ref 0.3–1.2)
Total Protein: 7 g/dL (ref 6.5–8.1)

## 2019-06-22 LAB — CBC
HCT: 40.9 % (ref 39.0–52.0)
Hemoglobin: 13.5 g/dL (ref 13.0–17.0)
MCH: 31 pg (ref 26.0–34.0)
MCHC: 33 g/dL (ref 30.0–36.0)
MCV: 94 fL (ref 80.0–100.0)
Platelets: 171 10*3/uL (ref 150–400)
RBC: 4.35 MIL/uL (ref 4.22–5.81)
RDW: 17.7 % — ABNORMAL HIGH (ref 11.5–15.5)
WBC: 11.1 10*3/uL — ABNORMAL HIGH (ref 4.0–10.5)
nRBC: 0 % (ref 0.0–0.2)

## 2019-06-22 LAB — URINALYSIS, ROUTINE W REFLEX MICROSCOPIC
Bilirubin Urine: NEGATIVE
Glucose, UA: NEGATIVE mg/dL
Hgb urine dipstick: NEGATIVE
Ketones, ur: NEGATIVE mg/dL
Leukocytes,Ua: NEGATIVE
Nitrite: NEGATIVE
Protein, ur: NEGATIVE mg/dL
Specific Gravity, Urine: 1.026 (ref 1.005–1.030)
pH: 5 (ref 5.0–8.0)

## 2019-06-22 LAB — LIPASE, BLOOD: Lipase: 28 U/L (ref 11–51)

## 2019-06-22 MED ORDER — SODIUM CHLORIDE 0.9% FLUSH: 3.0000 mL | Freq: Once | INTRAVENOUS | Status: DC

## 2019-06-22 MED ORDER — SODIUM CHLORIDE (PF) 0.9 % IJ SOLN
INTRAMUSCULAR | Status: AC
  Filled 2019-06-22: qty 50

## 2019-06-22 MED ORDER — IOHEXOL 350 MG/ML SOLN
100.0000 mL | Freq: Once | INTRAVENOUS | Status: AC | PRN
  Administered 2019-06-22: 100 mL via INTRAVENOUS

## 2019-06-22 NOTE — ED Triage Notes (Signed)
Pt presents with c/o lower abdominal pain that occurred last night. Pt denies any N/V/D. Pt is a cancer pt, chemo yesterday. Pt also had Covid 3 weeks ago but has since recovered.

## 2019-06-22 NOTE — ED Provider Notes (Signed)
Emergency Department Provider Note   I have reviewed the triage vital signs and the nursing notes.   HISTORY  Chief Complaint Abdominal Pain   HPI SOK DOLTON is a 64 y.o. 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  Patient reports intermittent severe pain which initially woke him from sleep early this morning.  Setting pain in the left which radiates over to the right lower abdomen.  He notes that some of the left-sided pain radiates up into the lower chest but denies feeling short of breath.  He is not having fevers.  He has had some constipation today which is not completely unusual for him in the setting of chemotherapy.  Last infusion was yesterday and continues to take oral chemotherapy as well.  His primary oncologist is Dr. Benay Spice.   Past Medical History:  Diagnosis Date  . Cataract 2015   right ecl with lens inplant  . GERD (gastroesophageal reflux disease)   . HTN (hypertension)   . Primary cancer of cecum ([place]) dx'd 01/25/19    Patient Active Problem List   Diagnosis Date Noted  . Pneumonia due to COVID-19 virus 06/02/2019  . GERD (gastroesophageal reflux disease)   . Primary cancer of cecum s/p lap right proximal colectomy 03/15/2019   . S/P right hemicolectomy 03/15/2019  . Benign essential hypertension 06/22/2015  . Chronic dryness of both eyes 06/22/2015  . Cortical age-related cataract of left eye 06/22/2015  . ED (erectile dysfunction) 06/22/2015  . Impaired fasting glucose 06/22/2015  . Pigmentary glaucoma of both eyes, indeterminate stage 06/22/2015  . Pseudophakia, right eye 06/22/2015  . Cataract 2015    Past Surgical History:  Procedure Laterality Date  . ENDOSCOPIC VEIN LASER TREATMENT Bilateral 2020   legs   . INGUINAL HERNIA REPAIR Right    age 70  . LAPAROSCOPIC PARTIAL COLECTOMY Right  03/15/2019   Procedure: LAPAROSCOPIC RIGHT HEMICOLECTOMY;  Surgeon: Ileana Roup, MD;  Location: [facility];  Service: General;  Laterality: Right;  . MENISCUS REPAIR Bilateral   . VARICOSE VEIN SURGERY Right     Allergies Patient has no known allergies.  Family History  Problem Relation Age of Onset  . Hypertension Mother   . Hypertension Father   . Colon cancer Maternal Grandmother 86  . Cancer Paternal Grandmother        ? intestinal    Social History Social History   Tobacco Use  . Smoking status: Never Smoker  . Smokeless tobacco: Never Used  Substance Use Topics  . Alcohol use: Yes    Comment: weekends  . Drug use: Yes    Types: Marijuana    Comment: 03-12-2019 , last use x 1 month ago     Review of Systems  Constitutional: No fever/chills Eyes: No visual changes. ENT: No sore throat. Cardiovascular: Denies chest pain. Respiratory: Denies shortness of breath. Gastrointestinal: Positive abdominal pain.  No nausea, no vomiting.  No diarrhea.  No constipation. Genitourinary: Negative for dysuria. Musculoskeletal: Negative for back pain. Skin: Negative for rash. Neurological: Negative for headaches, focal weakness or numbness.  10-point ROS otherwise negative.  ____________________________________________   PHYSICAL EXAM:  VITAL SIGNS: ED Triage Vitals  Enc Vitals Group     BP 06/22/19 1252 (!) 145/96     Pulse Rate 06/22/19 1252 (!) 106     Resp 06/22/19 1252 18  Temp 06/22/19 1252 97.7 F (36.5 C)     Temp Source 06/22/19 1252 Oral     SpO2 06/22/19 1252 97 %   Constitutional: Alert and oriented. Well appearing and in no acute distress. Eyes: Conjunctivae are normal. Head: Atraumatic. Nose: No congestion/rhinnorhea. Mouth/Throat: Mucous membranes are moist.   Neck: No stridor.   Cardiovascular: Tachycardia. Good peripheral circulation. Grossly normal heart sounds.   Respiratory: Normal respiratory effort.  No retractions. Lungs  CTAB. Gastrointestinal: Soft with mild diffuse tenderness. No distention.  Musculoskeletal: No gross deformities of extremities. Neurologic:  Normal speech and language.  Skin:  Skin is warm, dry and intact. No rash noted.  ____________________________________________   LABS (all labs ordered are listed, but only abnormal results are displayed)  Labs Reviewed  COMPREHENSIVE METABOLIC PANEL - Abnormal; Notable for the following components:      Result Value   Glucose, Bld 111 (*)    BUN 24 (*)    Albumin 3.4 (*)    ALT 45 (*)    All other components within normal limits  CBC - Abnormal; Notable for the following components:   WBC 11.1 (*)    RDW 17.7 (*)    All other components within normal limits  LIPASE, BLOOD  URINALYSIS, ROUTINE W REFLEX MICROSCOPIC   ______________________________________  RADIOLOGY  CT Angio Chest PE W and/or Wo Contrast  Result Date: 06/22/2019 CLINICAL DATA:  LEFT abdominal pain radiating into chest. Lower abdominal pain starting last night. History of cancer and chemotherapy yesterday. Shortness of breath. EXAM: CT ANGIOGRAPHY CHEST CT ABDOMEN AND PELVIS WITH CONTRAST TECHNIQUE: Multidetector CT imaging of the chest was performed using the standard protocol during bolus administration of intravenous contrast. Multiplanar CT image reconstructions and MIPs were obtained to evaluate the vascular anatomy. Multidetector CT imaging of the abdomen and pelvis was performed using the standard protocol during bolus administration of intravenous contrast. CONTRAST:  114mL OMNIPAQUE IOHEXOL 350 MG/ML SOLN COMPARISON:  CT chest and abdomen dated 02/06/2019. FINDINGS: CTA CHEST FINDINGS Cardiovascular: The majority of the peripheral segmental and subsegmental pulmonary artery branches cannot be definitively characterized due to patient breathing motion artifact. There is no pulmonary embolism identified within the main, lobar or central segmental pulmonary arteries  bilaterally. No thoracic aortic aneurysm or evidence of aortic dissection. No pericardial effusion. Mediastinum/Nodes: No mass or enlarged lymph nodes seen within the mediastinum or perihilar regions. Esophagus appears normal. Trachea and central bronchi are unremarkable. Lungs/Pleura: New patchy peripheral consolidations throughout both lungs, most confluent within the lower lobes bilaterally, some components of ground-glass opacity. No pleural effusion or pneumothorax. Incidental note made of a LEFT-sided Bochdalek's hernia. Musculoskeletal: Mild scoliosis of the thoracic spine. No acute or suspicious osseous finding. Review of the MIP images confirms the above findings. CT ABDOMEN and PELVIS FINDINGS Hepatobiliary: No focal liver abnormality is seen. No gallstones, gallbladder wall thickening, or biliary dilatation. Pancreas: Unremarkable. No pancreatic ductal dilatation or surrounding inflammatory changes. Spleen: Normal in size without focal abnormality. Adrenals/Urinary Tract: Adrenal glands appear normal. Kidneys are unremarkable without mass, stone or hydronephrosis. No perinephric fluid. Bladder is decompressed limiting characterization of its walls. 1 mm calcification within the LEFT lower pelvis, of uncertain relationship to the distal LEFT ureter, but not seen on the earlier CT and therefore concerning for a distal intraureteral stone. No associated hydroureter or hydronephrosis. Stomach/Bowel: No dilated large or small bowel loops. No evidence of bowel wall inflammation. Enterocolic anastomosis within the RIGHT abdomen, without obstruction inflammation or other surgical complicating feature.  Stomach is unremarkable, partially decompressed. Vascular/Lymphatic: Aortic atherosclerosis. No acute appearing vascular abnormality. No enlarged lymph nodes seen. Reproductive: Prostate gland is mildly prominent in size with central dystrophic calcifications. Other: No free fluid or abscess collection seen. No free  intraperitoneal air. Musculoskeletal: Mild degenerative spondylosis of the lower lumbar spine. No acute or suspicious osseous finding. Small periumbilical abdominal wall hernia which contains fat only. Bilateral inguinal hernias which contain fat only. Review of the MIP images confirms the above findings. IMPRESSION: 1. New patchy peripheral consolidations throughout both lungs, most confluent within the lower lobes bilaterally, some components of ground-glass opacity. Differential includes atypical pneumonias such as viral or fungal, interstitial pneumonias, and mix of edema and atelectasis. [id number] pneumonia can certainly have this appearance. 2. 1 mm calcification within the LEFT lower pelvis, of uncertain relationship to the distal LEFT ureter, but not seen on the earlier CT and therefore most likely a distal intra-ureteral stone. No associated hydroureter or hydronephrosis. 3. No other acute findings within the abdomen or pelvis. No bowel obstruction or evidence of bowel wall inflammation. No free fluid or abscess collection. No free intraperitoneal air. Aortic Atherosclerosis (ICD10-I70.0). Electronically Signed   By: Franki Cabot M.D.   On: 06/22/2019 15:54   CT ABDOMEN PELVIS W CONTRAST  Result Date: 06/22/2019 CLINICAL DATA:  LEFT abdominal pain radiating into chest. Lower abdominal pain starting last night. History of cancer and chemotherapy yesterday. Shortness of breath. EXAM: CT ANGIOGRAPHY CHEST CT ABDOMEN AND PELVIS WITH CONTRAST TECHNIQUE: Multidetector CT imaging of the chest was performed using the standard protocol during bolus administration of intravenous contrast. Multiplanar CT image reconstructions and MIPs were obtained to evaluate the vascular anatomy. Multidetector CT imaging of the abdomen and pelvis was performed using the standard protocol during bolus administration of intravenous contrast. CONTRAST:  120mL OMNIPAQUE IOHEXOL 350 MG/ML SOLN COMPARISON:  CT chest and abdomen dated  02/06/2019. FINDINGS: CTA CHEST FINDINGS Cardiovascular: The majority of the peripheral segmental and subsegmental pulmonary artery branches cannot be definitively characterized due to patient breathing motion artifact. There is no pulmonary embolism identified within the main, lobar or central segmental pulmonary arteries bilaterally. No thoracic aortic aneurysm or evidence of aortic dissection. No pericardial effusion. Mediastinum/Nodes: No mass or enlarged lymph nodes seen within the mediastinum or perihilar regions. Esophagus appears normal. Trachea and central bronchi are unremarkable. Lungs/Pleura: New patchy peripheral consolidations throughout both lungs, most confluent within the lower lobes bilaterally, some components of ground-glass opacity. No pleural effusion or pneumothorax. Incidental note made of a LEFT-sided Bochdalek's hernia. Musculoskeletal: Mild scoliosis of the thoracic spine. No acute or suspicious osseous finding. Review of the MIP images confirms the above findings. CT ABDOMEN and PELVIS FINDINGS Hepatobiliary: No focal liver abnormality is seen. No gallstones, gallbladder wall thickening, or biliary dilatation. Pancreas: Unremarkable. No pancreatic ductal dilatation or surrounding inflammatory changes. Spleen: Normal in size without focal abnormality. Adrenals/Urinary Tract: Adrenal glands appear normal. Kidneys are unremarkable without mass, stone or hydronephrosis. No perinephric fluid. Bladder is decompressed limiting characterization of its walls. 1 mm calcification within the LEFT lower pelvis, of uncertain relationship to the distal LEFT ureter, but not seen on the earlier CT and therefore concerning for a distal intraureteral stone. No associated hydroureter or hydronephrosis. Stomach/Bowel: No dilated large or small bowel loops. No evidence of bowel wall inflammation. Enterocolic anastomosis within the RIGHT abdomen, without obstruction inflammation or other surgical complicating  feature. Stomach is unremarkable, partially decompressed. Vascular/Lymphatic: Aortic atherosclerosis. No acute appearing vascular abnormality. No enlarged lymph  nodes seen. Reproductive: Prostate gland is mildly prominent in size with central dystrophic calcifications. Other: No free fluid or abscess collection seen. No free intraperitoneal air. Musculoskeletal: Mild degenerative spondylosis of the lower lumbar spine. No acute or suspicious osseous finding. Small periumbilical abdominal wall hernia which contains fat only. Bilateral inguinal hernias which contain fat only. Review of the MIP images confirms the above findings. IMPRESSION: 1. New patchy peripheral consolidations throughout both lungs, most confluent within the lower lobes bilaterally, some components of ground-glass opacity. Differential includes atypical pneumonias such as viral or fungal, interstitial pneumonias, and mix of edema and atelectasis. [id number] pneumonia can certainly have this appearance. 2. 1 mm calcification within the LEFT lower pelvis, of uncertain relationship to the distal LEFT ureter, but not seen on the earlier CT and therefore most likely a distal intra-ureteral stone. No associated hydroureter or hydronephrosis. 3. No other acute findings within the abdomen or pelvis. No bowel obstruction or evidence of bowel wall inflammation. No free fluid or abscess collection. No free intraperitoneal air. Aortic Atherosclerosis (ICD10-I70.0). Electronically Signed   By: Franki Cabot M.D.   On: 06/22/2019 15:54    ____________________________________________   PROCEDURES  Procedure(s) performed:   Procedures  None  ____________________________________________   INITIAL IMPRESSION / ASSESSMENT AND PLAN / ED COURSE  Pertinent labs & imaging results that were available during my care of the patient were reviewed by me and considered in my medical decision making (see chart for details).   Patient presents to the emergency  department for evaluation of left-sided abdominal pain with some pain radiating up into the left chest.  Patient does have tachycardia along with active cancer.  While I suspect this is primarily GI in origin with patient's tachycardia I do plan for CTA of the chest to evaluate for PE.  Not only does he have increased risk with cancer but he was diagnosed with COVID-19 recently.  He is not hypoxic or displaying increased work of breathing on exam.  Plan for also CT abdomen pelvis with contrast.  Labs reviewed with no acute findings.  Patient has minimum pain on reassessment.  Care transferred to Dr. Tyrone Nine pending CT imaging reads.    ____________________________________________  FINAL CLINICAL IMPRESSION(S) / ED DIAGNOSES  Final diagnoses:  Left upper quadrant abdominal pain     MEDICATIONS GIVEN DURING THIS VISIT:  Medications  iohexol (OMNIPAQUE) 350 MG/ML injection 100 mL (100 mLs Intravenous Contrast Given 06/22/19 1431)    Note:  This document was prepared using Dragon voice recognition software and may include unintentional dictation errors.  Nanda Quinton, MD, [facility] Emergency [facility]    Yasmina Chico, [facility], MD 06/23/19 [phone]

## 2019-06-22 NOTE — ED Provider Notes (Signed)
Received patient in signout from Dr. Laverta Baltimore, briefly the patient is a 64 year old male with a chief complaints of colicky epigastric and left upper quadrant abdominal pain.  Plan for a CT scan of the chest abdomen pelvis and reassess.  CT scan with pneumonia, no other acute findings.  Questionable kidney stone though without hydronephrosis and not definitely seen in the ureter.  Unknown significance.  Patient without any lower or flank pain.  Pain seems all the anterior and will be on the right as well as left.  Could be due to pneumonia.  Patient was diagnosed with the novel coronavirus 3 weeks ago.  Under these findings would be typical of that.  He is not had worse cough or fever or worsening shortness of breath.  We will hold off on antibiotics at this time.  Pain seems more consistent with colonic spasms.  We will have him do a trial of MiraLAX at home.  Have him call his oncologist on Monday. \ He will return for worsening pain fever or inability to eat or drink.   Deno Etienne, DO 06/22/19 [phone]

## 2019-06-22 NOTE — Discharge Instructions (Signed)
Take 8 scoops of miralax in 32oz of whatever you would like to drink.(Gatorade comes in this size) You can also use a fleets enema which you can buy over the counter at the pharmacy.  Return for worsening abdominal pain, vomiting or fever. ? ?

## 2019-06-22 NOTE — ED Notes (Signed)
Pt returned from CT °

## 2019-06-24 ENCOUNTER — Telehealth: Payer: Self-pay | Admitting: *Deleted

## 2019-06-24 NOTE — Telephone Encounter (Signed)
Notification that patient went to ER over the weekend for left lateral chest wall pain. PE was ruled out. States having #4 episodes of pain beginning Saturday at 0200. Today he has had no pain in over 24 hours. NO shortness of breath or fever. He understands to call for any future pain.

## 2019-06-25 ENCOUNTER — Telehealth: Payer: Self-pay | Admitting: Oncology

## 2019-06-25 NOTE — Telephone Encounter (Signed)
Scheduled per los. Called and spoke with patient. Confirmed appts  

## 2019-07-05 ENCOUNTER — Inpatient Hospital Stay (HOSPITAL_BASED_OUTPATIENT_CLINIC_OR_DEPARTMENT_OTHER): Payer: BC Managed Care – PPO | Admitting: Oncology

## 2019-07-05 ENCOUNTER — Inpatient Hospital Stay: Payer: BC Managed Care – PPO

## 2019-07-05 ENCOUNTER — Other Ambulatory Visit: Payer: Self-pay

## 2019-07-05 VITALS — BP 145/74 | HR 109 | Temp 97.8°F | Resp 18 | Ht 70.0 in | Wt 175.8 lb

## 2019-07-05 DIAGNOSIS — T451X5D Adverse effect of antineoplastic and immunosuppressive drugs, subsequent encounter: Secondary | ICD-10-CM | POA: Diagnosis not present

## 2019-07-05 DIAGNOSIS — C180 Malignant neoplasm of cecum: Secondary | ICD-10-CM

## 2019-07-05 DIAGNOSIS — Z8616 Personal history of COVID-19: Secondary | ICD-10-CM | POA: Diagnosis not present

## 2019-07-05 DIAGNOSIS — K521 Toxic gastroenteritis and colitis: Secondary | ICD-10-CM | POA: Diagnosis not present

## 2019-07-05 DIAGNOSIS — Z5111 Encounter for antineoplastic chemotherapy: Secondary | ICD-10-CM | POA: Diagnosis present

## 2019-07-05 LAB — CBC WITH DIFFERENTIAL (CANCER CENTER ONLY)
Abs Immature Granulocytes: 0.1 10*3/uL — ABNORMAL HIGH (ref 0.00–0.07)
Basophils Absolute: 0 10*3/uL (ref 0.0–0.1)
Basophils Relative: 1 %
Eosinophils Absolute: 0.1 10*3/uL (ref 0.0–0.5)
Eosinophils Relative: 1 %
HCT: 37.8 % — ABNORMAL LOW (ref 39.0–52.0)
Hemoglobin: 12.6 g/dL — ABNORMAL LOW (ref 13.0–17.0)
Immature Granulocytes: 1 %
Lymphocytes Relative: 25 %
Lymphs Abs: 1.8 10*3/uL (ref 0.7–4.0)
MCH: 31.1 pg (ref 26.0–34.0)
MCHC: 33.3 g/dL (ref 30.0–36.0)
MCV: 93.3 fL (ref 80.0–100.0)
Monocytes Absolute: 1.1 10*3/uL — ABNORMAL HIGH (ref 0.1–1.0)
Monocytes Relative: 15 %
Neutro Abs: 4 10*3/uL (ref 1.7–7.7)
Neutrophils Relative %: 57 %
Platelet Count: 342 10*3/uL (ref 150–400)
RBC: 4.05 MIL/uL — ABNORMAL LOW (ref 4.22–5.81)
RDW: 18.8 % — ABNORMAL HIGH (ref 11.5–15.5)
WBC Count: 7.1 10*3/uL (ref 4.0–10.5)
nRBC: 0 % (ref 0.0–0.2)

## 2019-07-05 LAB — CMP (CANCER CENTER ONLY)
ALT: 27 U/L (ref 0–44)
AST: 31 U/L (ref 15–41)
Albumin: 3.2 g/dL — ABNORMAL LOW (ref 3.5–5.0)
Alkaline Phosphatase: 129 U/L — ABNORMAL HIGH (ref 38–126)
Anion gap: 9 (ref 5–15)
BUN: 19 mg/dL (ref 8–23)
CO2: 25 mmol/L (ref 22–32)
Calcium: 9.1 mg/dL (ref 8.9–10.3)
Chloride: 109 mmol/L (ref 98–111)
Creatinine: 1.05 mg/dL (ref 0.61–1.24)
GFR, Est AFR Am: >60 mL/min (ref >60)
GFR, Estimated: >60 mL/min (ref >60)
Glucose, Bld: 112 mg/dL — ABNORMAL HIGH (ref 70–99)
Potassium: 4.2 mmol/L (ref 3.5–5.1)
Sodium: 143 mmol/L (ref 135–145)
Total Bilirubin: 0.6 mg/dL (ref 0.3–1.2)
Total Protein: 6.3 g/dL — ABNORMAL LOW (ref 6.5–8.1)

## 2019-07-05 NOTE — Progress Notes (Addendum)
[facility] OFFICE PROGRESS NOTE   Diagnosis: Colon cancer  INTERVAL HISTORY:   Isaiah Gray began cycle 4 CAPOX on 06/21/2019.  He completed capecitabine yesterday.  He reports 2 episodes of diarrhea over the past 2 weeks.  He continues to have cold sensitivity in the hands.  No peripheral numbness.  He reports persistent exertional dyspnea, though this has improved.  Objective:  Vital signs in last 24 hours:  Blood pressure (!) 145/74, pulse (!) 109, temperature 97.8 F (36.6 C), temperature source Temporal, resp. rate 18, height 5' 10"  (1.778 m), weight 175 lb 12.8 oz (79.7 kg), SpO2 96 %.    Resp: End inspiratory rales at the posterior lung base bilaterally, no respiratory distress Cardio: Regular rate and rhythm GI: No hepatosplenomegaly, nontender, no mass Vascular: No leg edema  Skin: Palms without erythema, skin thickening and superficial dry desquamation at the soles   Lab Results:  Lab Results  Component Value Date   WBC 7.1 07/05/2019   HGB 12.6 (L) 07/05/2019   HCT 37.8 (L) 07/05/2019   MCV 93.3 07/05/2019   PLT 342 07/05/2019   NEUTROABS 4.0 07/05/2019    CMP  Lab Results  Component Value Date   NA 140 06/22/2019   K 4.0 06/22/2019   CL 107 06/22/2019   CO2 22 06/22/2019   GLUCOSE 111 (H) 06/22/2019   BUN 24 (H) 06/22/2019   CREATININE 0.90 06/22/2019   CALCIUM 9.3 06/22/2019   PROT 7.0 06/22/2019   ALBUMIN 3.4 (L) 06/22/2019   AST 37 06/22/2019   ALT 45 (H) 06/22/2019   ALKPHOS 119 06/22/2019   BILITOT 0.9 06/22/2019   GFRNONAA >60 06/22/2019   GFRAA >60 06/22/2019     Medications: I have reviewed the patient's current medications.   Assessment/Plan: 1. Adenocarcinoma of the cecum, stage IIIb (T3N1B)   Right hemicolectomy 03/15/2019 moderate-poorly differentiated, lymphovascular invasion present, 2/17 lymph nodes positive, no loss of mismatch repair protein expression, MSI-low (MSI low pattern also seen in adjacent normal  tissue-possible polymorphism in normal DNA, single locus of instability observed)  Colonoscopy 01/25/2019-cecal mass, multiple polyps including the ascending, transverse, and sigmoid colon, pathology from the cecum-adenocarcinoma, pathology from the transverse and sigmoid polyps-tubular adenomas  Tubular adenoma at the proximal ascending colon on the right colectomy specimen  CTs 02/06/2019-mass at the medial wall of the cecum, 2 small ileocolic nodes measuring 5 mm, 4 mm irregular subpleural nodule in the anterior right middle lobe-favored to be a benign subpleural lymph node  Elevated preoperative CEA, normal 04/05/2019  Cycle 1 CAPOX 04/10/2019  Cycle 2 CAPOX 05/01/2019  Cycle 3 CAPOX 05/22/2019, Xeloda dose reduced secondary to hand/foot syndrome and diarrhea, Xeloda discontinued 05/28/2019 secondary to diarrhea and COVID-19 infection  Cycle 4 CAPOX 06/21/2019 2. Gastroesophageal reflux 3. Hypertension 4.   Bilateral inguinal hernias on CT 02/06/19 5.   Family history of colon cancer 6.   Multiple tubular adenomas on the colonoscopy 01/25/2019 and a tubular adenoma on the right colon resection specimen 7.   Elevated liver enzymes-likely secondary to oxaliplatin 8.  Diarrhea, cough, dehydration 05/30/2019-referred to the emergency room and diagnosed with COVID-19 infection, admitted 06/02/2019 completed course of remdesivir and steroids     Disposition: Isaiah Gray has completed the course of adjuvant chemotherapy.  He will return for an office visit and CEA in 3 months.  He continues to recover from the recent COVID-19 infection.  The hand/foot symptoms should improve over the next few weeks.  The cold sensitivity should  also improve.  Betsy Coder, MD  07/05/2019  8:31 AM

## 2019-07-05 NOTE — Patient Instructions (Signed)
Please provide the office a copy of your medical Advanced Directive document when you have the opportunity.

## 2019-07-08 ENCOUNTER — Other Ambulatory Visit: Payer: Self-pay

## 2019-07-09 ENCOUNTER — Telehealth: Payer: Self-pay | Admitting: Oncology

## 2019-07-09 NOTE — Telephone Encounter (Signed)
Scheduled per 2/26 los. Called and spoke with patient. Confirmed appt  

## 2019-07-14 ENCOUNTER — Other Ambulatory Visit: Payer: Self-pay | Admitting: Oncology

## 2019-08-14 ENCOUNTER — Other Ambulatory Visit: Payer: Self-pay | Admitting: Oncology

## 2019-09-30 ENCOUNTER — Other Ambulatory Visit: Payer: Self-pay

## 2019-09-30 ENCOUNTER — Inpatient Hospital Stay: Payer: BC Managed Care – PPO | Attending: Oncology

## 2019-09-30 ENCOUNTER — Inpatient Hospital Stay: Payer: BC Managed Care – PPO

## 2019-09-30 DIAGNOSIS — C180 Malignant neoplasm of cecum: Secondary | ICD-10-CM | POA: Diagnosis not present

## 2019-09-30 LAB — CEA (IN HOUSE-CHCC): CEA (CHCC-In House): 1.49 ng/mL (ref 0.00–5.00)

## 2019-10-01 ENCOUNTER — Telehealth: Payer: Self-pay | Admitting: *Deleted

## 2019-10-01 ENCOUNTER — Telehealth: Payer: Self-pay | Admitting: Oncology

## 2019-10-01 DIAGNOSIS — C180 Malignant neoplasm of cecum: Secondary | ICD-10-CM

## 2019-10-01 NOTE — Telephone Encounter (Signed)
Scheduled appt per 5/25 sch mesasge =- pt aware of appt date and time/

## 2019-10-01 NOTE — Telephone Encounter (Signed)
Notified of normal CEA results and can reschedule f/u in 3 months unless he is having any issues needing to be seen. He reports doing well, except has some tingling/numbness in toes and balls of feet. Unsure if related to chemo or his new arch supports. Confirmed that it can show up even after tx is completed, but nothing to do unless it is causing pain. He denies any pain. Will call if needed in future.

## 2019-10-01 NOTE — Telephone Encounter (Signed)
-----   Message from Ladell Pier, MD sent at 10/01/2019  6:38 AM EDT ----- Please call patient, cea is normal, was supposed to have office visit, scheduled cea and office for 3 months

## 2020-01-02 ENCOUNTER — Telehealth: Payer: Self-pay | Admitting: Oncology

## 2020-01-02 ENCOUNTER — Other Ambulatory Visit: Payer: Self-pay

## 2020-01-02 ENCOUNTER — Inpatient Hospital Stay: Payer: BC Managed Care – PPO

## 2020-01-02 ENCOUNTER — Inpatient Hospital Stay: Payer: BC Managed Care – PPO | Attending: Oncology | Admitting: Oncology

## 2020-01-02 VITALS — BP 146/92 | HR 81 | Temp 97.5°F | Resp 18 | Ht 70.0 in | Wt 186.7 lb

## 2020-01-02 DIAGNOSIS — T451X5A Adverse effect of antineoplastic and immunosuppressive drugs, initial encounter: Secondary | ICD-10-CM | POA: Diagnosis not present

## 2020-01-02 DIAGNOSIS — G620 Drug-induced polyneuropathy: Secondary | ICD-10-CM | POA: Insufficient documentation

## 2020-01-02 DIAGNOSIS — I10 Essential (primary) hypertension: Secondary | ICD-10-CM | POA: Diagnosis not present

## 2020-01-02 DIAGNOSIS — Z800 Family history of malignant neoplasm of digestive organs: Secondary | ICD-10-CM | POA: Diagnosis not present

## 2020-01-02 DIAGNOSIS — C180 Malignant neoplasm of cecum: Secondary | ICD-10-CM | POA: Diagnosis not present

## 2020-01-02 LAB — CEA (IN HOUSE-CHCC): CEA (CHCC-In House): 1.36 ng/mL (ref 0.00–5.00)

## 2020-01-02 NOTE — Telephone Encounter (Signed)
Scheduled appointments per 8/26 los. Patient is aware of appointments dates and times.  

## 2020-01-02 NOTE — Progress Notes (Signed)
[facility] OFFICE PROGRESS NOTE   Diagnosis: Colon cancer  INTERVAL HISTORY:   Mr. [name] returns as scheduled.  He feels well.  He is exercising.  He developed numbness at the distal feet and toes beginning in May.  This has partially improved.  The numbness does not interfere with activity.  Objective:  Vital signs in last 24 hours:  Blood pressure (!) 146/92, pulse 81, temperature (!) 97.5 F (36.4 C), temperature source Axillary, resp. rate 18, height _0  (1.778 m), weight 186 lb 11.2 oz (84.7 kg), SpO2 100 %.   Lymphatics: No cervical, supraclavicular, axillary, or inguinal nodes Resp: Lungs clear bilaterally Cardio: Regular rate and rhythm GI: No hepatosplenomegaly, no mass, nontender Vascular: No leg edema   Lab Results:  Lab Results  Component Value Date   WBC 7.1 07/05/2019   HGB 12.6 (L) 07/05/2019   HCT 37.8 (L) 07/05/2019   MCV 93.3 07/05/2019   PLT 342 07/05/2019   NEUTROABS 4.0 07/05/2019    CMP  Lab Results  Component Value Date   NA 143 07/05/2019   K 4.2 07/05/2019   CL 109 07/05/2019   CO2 25 07/05/2019   GLUCOSE 112 (H) 07/05/2019   BUN 19 07/05/2019   CREATININE 1.05 07/05/2019   CALCIUM 9.1 07/05/2019   PROT 6.3 (L) 07/05/2019   ALBUMIN 3.2 (L) 07/05/2019   AST 31 07/05/2019   ALT 27 07/05/2019   ALKPHOS 129 (H) 07/05/2019   BILITOT 0.6 07/05/2019   GFRNONAA >60 07/05/2019   GFRAA >60 07/05/2019    Lab Results  Component Value Date   CEA1 1.49 09/30/2019    Medications: I have reviewed the patient's current medications.   Assessment/Plan: 1. Adenocarcinoma of the cecum, stage IIIb (T3N1B)   Right hemicolectomy 03/15/2019 moderate-poorly differentiated, lymphovascular invasion present, 2/17 lymph nodes positive, no loss of mismatch repair protein expression, MSI-low (MSI low pattern also seen in adjacent normal tissue-possible polymorphism in normal DNA, single locus of instability observed)  Colonoscopy  01/25/2019-cecal mass, multiple polyps including the ascending, transverse, and sigmoid colon, pathology from the cecum-adenocarcinoma, pathology from the transverse and sigmoid polyps-tubular adenomas  Tubular adenoma at the proximal ascending colon on the right colectomy specimen  CTs 02/06/2019-mass at the medial wall of the cecum, 2 small ileocolic nodes measuring 5 mm, 4 mm irregular subpleural nodule in the anterior right middle lobe-favored to be a benign subpleural lymph node  Elevated preoperative CEA, normal 04/05/2019  Cycle 1 CAPOX 04/10/2019  Cycle 2 CAPOX 05/01/2019  Cycle 3 CAPOX 05/22/2019, Xeloda dose reduced secondary to hand/foot syndrome and diarrhea, Xeloda discontinued 05/28/2019 secondary to diarrhea and COVID-19 infection  Cycle 4 CAPOX 06/21/2019  CTs 06/22/2019-new patchy peripheral consolidations bilaterally, 1 mm calcification left lower pelvis-likely a distal ureteral stone 2. Gastroesophageal reflux 3. Hypertension 4.   Bilateral inguinal hernias on CT 02/06/19 5.   Family history of colon cancer 6.   Multiple tubular adenomas on the colonoscopy 01/25/2019 and a tubular adenoma on the right colon resection specimen 7.   Elevated liver enzymes-likely secondary to oxaliplatin 8.  Diarrhea, cough, dehydration 05/30/2019-referred to the emergency room and diagnosed with COVID-19 infection, admitted 06/02/2019 completed course of remdesivir and steroids 9.  Oxaliplatin neuropathy       Disposition: Isaiah Gray remains in clinical remission from colon cancer.  He will be scheduled for restaging CTs and an office visit in approximately 3 months.  He will be due for a surveillance colonoscopy this fall.  He  appears to have mild oxaliplatin neuropathy involving the feet.  Hopefully this will improve over the next several months.  Betsy Coder, MD  01/02/2020  8:53 AM

## 2020-01-03 ENCOUNTER — Telehealth: Payer: Self-pay

## 2020-01-03 NOTE — Telephone Encounter (Signed)
Pt called message left stating theres nothing alarming just wanted to update on lab results please call back with any questions concerns or changes

## 2020-01-03 NOTE — Telephone Encounter (Signed)
-----   Message from Ladell Pier, MD sent at 01/02/2020 10:16 AM EDT ----- Please call patient, CEA is normal

## 2020-02-04 ENCOUNTER — Encounter: Payer: Self-pay | Admitting: Gastroenterology

## 2020-02-21 ENCOUNTER — Other Ambulatory Visit: Payer: Self-pay

## 2020-02-21 ENCOUNTER — Ambulatory Visit (AMBULATORY_SURGERY_CENTER): Payer: Self-pay

## 2020-02-21 VITALS — Ht 70.0 in | Wt 186.0 lb

## 2020-02-21 DIAGNOSIS — Z85038 Personal history of other malignant neoplasm of large intestine: Secondary | ICD-10-CM

## 2020-02-21 MED ORDER — PLENVU 140 G PO SOLR: 1.0000 | ORAL | 0 refills | Status: DC

## 2020-02-21 NOTE — Progress Notes (Signed)
No egg or soy allergy known to patient  No issues with past sedation with any surgeries or procedures No intubation problems in the past  No FH of Malignant Hyperthermia No diet pills per patient No home 02 use per patient  No blood thinners per patient  Pt denies issues with constipation  No A fib or A flutter  EMMI video via MyChart  COVID 19 guidelines implemented in [facility] today with Pt and RN  Coupon given to pt in [facility] today , Code to Pharmacy  COVID vaccines completed on 08/2019 per pt;  Due to the COVID-19 pandemic we are asking patients to follow these guidelines. Please only bring one care partner. Please be aware that your care partner may wait in the car in the parking lot or if they feel like they will be too hot to wait in the car, they may wait in the lobby on the 4th floor. All care partners are required to wear a mask the entire time (we do not have any that we can provide them), they need to practice social distancing, and we will do a Covid check for all patient's and care partners when you arrive. Also we will check their temperature and your temperature. If the care partner waits in their car they need to stay in the parking lot the entire time and we will call them on their cell phone when the patient is ready for discharge so they can bring the car to the front of the building. Also all patient's will need to wear a mask into building.  

## 2020-02-25 ENCOUNTER — Encounter: Payer: Self-pay | Admitting: Gastroenterology

## 2020-03-16 ENCOUNTER — Ambulatory Visit: Payer: BC Managed Care – PPO | Admitting: Nurse Practitioner

## 2020-03-16 ENCOUNTER — Inpatient Hospital Stay: Payer: BC Managed Care – PPO | Attending: Oncology

## 2020-03-16 ENCOUNTER — Other Ambulatory Visit: Payer: Self-pay

## 2020-03-16 DIAGNOSIS — Z23 Encounter for immunization: Secondary | ICD-10-CM | POA: Diagnosis not present

## 2020-03-16 DIAGNOSIS — G620 Drug-induced polyneuropathy: Secondary | ICD-10-CM | POA: Insufficient documentation

## 2020-03-16 DIAGNOSIS — I10 Essential (primary) hypertension: Secondary | ICD-10-CM | POA: Diagnosis not present

## 2020-03-16 DIAGNOSIS — E860 Dehydration: Secondary | ICD-10-CM | POA: Diagnosis not present

## 2020-03-16 DIAGNOSIS — C180 Malignant neoplasm of cecum: Secondary | ICD-10-CM | POA: Insufficient documentation

## 2020-03-16 LAB — BASIC METABOLIC PANEL - CANCER CENTER ONLY
Anion gap: 5 (ref 5–15)
BUN: 18 mg/dL (ref 8–23)
CO2: 28 mmol/L (ref 22–32)
Calcium: 9.3 mg/dL (ref 8.9–10.3)
Chloride: 108 mmol/L (ref 98–111)
Creatinine: 1.13 mg/dL (ref 0.61–1.24)
GFR, Estimated: >60 mL/min (ref >60)
Glucose, Bld: 81 mg/dL (ref 70–99)
Potassium: 4.3 mmol/L (ref 3.5–5.1)
Sodium: 141 mmol/L (ref 135–145)

## 2020-03-16 LAB — CEA (IN HOUSE-CHCC): CEA (CHCC-In House): 1.4 ng/mL (ref 0.00–5.00)

## 2020-03-17 ENCOUNTER — Ambulatory Visit
Admission: RE | Admit: 2020-03-17 | Discharge: 2020-03-17 | Disposition: A | Discharge status: Home / Self Care | Payer: BC Managed Care – PPO | Source: Ambulatory Visit | Attending: Oncology | Admitting: Oncology

## 2020-03-17 DIAGNOSIS — C180 Malignant neoplasm of cecum: Secondary | ICD-10-CM

## 2020-03-17 MED ORDER — IOPAMIDOL (ISOVUE-300) INJECTION 61%
100.0000 mL | Freq: Once | INTRAVENOUS | Status: AC | PRN
  Administered 2020-03-17: 100 mL via INTRAVENOUS

## 2020-03-19 ENCOUNTER — Encounter: Payer: Self-pay | Admitting: Nurse Practitioner

## 2020-03-19 ENCOUNTER — Other Ambulatory Visit: Payer: Self-pay

## 2020-03-19 ENCOUNTER — Inpatient Hospital Stay (HOSPITAL_BASED_OUTPATIENT_CLINIC_OR_DEPARTMENT_OTHER): Payer: BC Managed Care – PPO | Admitting: Nurse Practitioner

## 2020-03-19 VITALS — BP 158/98 | HR 97 | Temp 97.8°F | Resp 17 | Ht 70.0 in | Wt 185.6 lb

## 2020-03-19 DIAGNOSIS — Z23 Encounter for immunization: Secondary | ICD-10-CM | POA: Diagnosis not present

## 2020-03-19 DIAGNOSIS — C180 Malignant neoplasm of cecum: Secondary | ICD-10-CM

## 2020-03-19 MED ORDER — INFLUENZA VAC SPLIT QUAD 0.5 ML IM SUSY
PREFILLED_SYRINGE | INTRAMUSCULAR | Status: AC
  Filled 2020-03-19: qty 0.5

## 2020-03-19 MED ORDER — INFLUENZA VAC SPLIT QUAD 0.5 ML IM SUSY
0.5000 mL | PREFILLED_SYRINGE | Freq: Once | INTRAMUSCULAR | Status: AC
  Administered 2020-03-19: 0.5 mL via INTRAMUSCULAR

## 2020-03-19 NOTE — Progress Notes (Addendum)
  [facility] OFFICE PROGRESS NOTE   Diagnosis: Colon cancer  INTERVAL HISTORY:   Isaiah Gray returns as scheduled.  He feels well.  No change in bowel habits.  No blood or pain with bowel movements.  No nausea or vomiting.  He continues to note numbness in the toes/balls of feet, mainly when he gets up in the mornings.  Objective:  Vital signs in last 24 hours:  Blood pressure (!) 158/98, pulse 97, temperature 97.8 F (36.6 C), temperature source Tympanic, resp. rate 17, height $RemoveBe'5\' 10"'[id number]$  (1.778 m), weight 185 lb 9.6 oz (84.2 kg), SpO2 100 %.    HEENT: Neck without mass. Lymphatics: No palpable cervical, supraclavicular, axillary or inguinal lymph nodes. Resp: Lungs clear bilaterally. Cardio: Regular rate and rhythm. GI: Abdomen soft and nontender.  No hepatomegaly. Vascular: No leg edema.  Lab Results:  Lab Results  Component Value Date   WBC 7.1 07/05/2019   HGB 12.6 (L) 07/05/2019   HCT 37.8 (L) 07/05/2019   MCV 93.3 07/05/2019   PLT 342 07/05/2019   NEUTROABS 4.0 07/05/2019    Imaging:  No results found.  Medications: I have reviewed the patient's current medications.  Assessment/Plan: 1. Adenocarcinoma of the cecum, stage IIIb (T3N1B)  ? Right hemicolectomy 03/15/2019 moderate-poorly differentiated, lymphovascular invasion present, 2/17 lymph nodes positive, no loss of mismatch repair protein expression, MSI-low (MSI low pattern also seen in adjacent normal tissue-possible polymorphism in normal DNA, single locus of instability observed) ? Colonoscopy 01/25/2019-cecal mass, multiple polyps including the ascending, transverse, and sigmoid colon, pathology from the cecum-adenocarcinoma, pathology from the transverse and sigmoid polyps-tubular adenomas ? Tubular adenoma at the proximal ascending colon on the right colectomy specimen ? CTs 02/06/2019-mass at the medial wall of the cecum, 2 small ileocolic nodes measuring 5 mm, 4 mm irregular subpleural nodule  in the anterior right middle lobe-favored to be a benign subpleural lymph node ? Elevated preoperative CEA, normal 04/05/2019 ? Cycle 1 CAPOX 04/10/2019 ? Cycle 2 CAPOX 05/01/2019 ? Cycle 3 CAPOX 05/22/2019, Xeloda dose reduced secondary to hand/foot syndrome and diarrhea, Xeloda discontinued 05/28/2019 secondary to diarrhea and COVID-19 infection ? Cycle 4 CAPOX 06/21/2019 ? CTs 06/22/2019-new patchy peripheral consolidations bilaterally, 1 mm calcification left lower pelvis-likely a distal ureteral stone ? CTs 03/17/2020-no metastatic disease; basilar predominant fibrotic interstitial lung disease favoring postinflammatory fibrosis 2. Gastroesophageal reflux 3. Hypertension 4.   Bilateral inguinal hernias on CT 02/06/19 5.   Family history of colon cancer 6.   Multiple tubular adenomas on the colonoscopy 01/25/2019 and a tubular adenoma on the right colon resection specimen 7.   Elevated liver enzymes-likely secondary to oxaliplatin 8.  Diarrhea, cough, dehydration 05/30/2019-referred to the emergency room and diagnosed with COVID-19 infection, admitted 06/02/2019 completed course of remdesivir and steroids 9.  Oxaliplatin neuropathy  Disposition: Isaiah Gray remains in remission from colon cancer.  Surveillance CT scans show no evidence of metastatic disease.  He has a colonoscopy scheduled next week.  He will return for a CEA and follow-up visit in 6 months.  He will receive the influenza vaccine today.  He will make arrangements to receive the Covid booster in approximately 2 weeks.  Patient seen with Dr. Benay Spice.  Ned Card ANP/GNP-BC   03/19/2020  9:52 AM This was a shared visit with Ned Card.  Isaiah Gray is in remission from colon cancer.  He will return for an office visit in 6 months.  Julieanne Manson, MD

## 2020-03-19 NOTE — Progress Notes (Signed)
Called and spoke with pharmacist at [facility]-the patient will be notified when RX is ready for pick up -pharmacist reports RX is completely paid for by insurance

## 2020-03-20 ENCOUNTER — Telehealth: Payer: Self-pay | Admitting: Nurse Practitioner

## 2020-03-20 NOTE — Telephone Encounter (Signed)
Scheduled appointments per 11/11 los. Mailed updated calendar to patient.

## 2020-03-27 ENCOUNTER — Ambulatory Visit (AMBULATORY_SURGERY_CENTER): Payer: BC Managed Care – PPO | Admitting: Gastroenterology

## 2020-03-27 ENCOUNTER — Other Ambulatory Visit: Payer: Self-pay

## 2020-03-27 ENCOUNTER — Encounter: Payer: Self-pay | Admitting: Gastroenterology

## 2020-03-27 VITALS — BP 118/68 | HR 82 | Temp 97.5°F | Resp 19 | Ht 70.0 in | Wt 186.0 lb

## 2020-03-27 DIAGNOSIS — D125 Benign neoplasm of sigmoid colon: Secondary | ICD-10-CM

## 2020-03-27 DIAGNOSIS — Z85038 Personal history of other malignant neoplasm of large intestine: Secondary | ICD-10-CM

## 2020-03-27 DIAGNOSIS — D127 Benign neoplasm of rectosigmoid junction: Secondary | ICD-10-CM

## 2020-03-27 MED ORDER — SODIUM CHLORIDE 0.9 % IV SOLN: 500.0000 mL | Freq: Once | INTRAVENOUS | Status: DC

## 2020-03-27 NOTE — Op Note (Signed)
[facility] Patient Name: Isaiah Gray Procedure Date: 03/27/2020 1:24 PM MRN: [id number] Endoscopist: Remo Lipps P. Havery Moros , MD Age: 64 Referring MD:  Date of Birth: March 26, 1956 Gender: Male Account #: [id number] Procedure:                Colonoscopy Indications:              High risk colon cancer surveillance: Personal                            history of colon cancer - 2020, s/p cecal / right                            colon resection, chemotherapy, in remission on                            imaging Medicines:                Monitored Anesthesia Care Procedure:                Pre-Anesthesia Assessment:                           - Prior to the procedure, a History and Physical                            was performed, and patient medications and                            allergies were reviewed. The patient's tolerance of                            previous anesthesia was also reviewed. The risks                            and benefits of the procedure and the sedation                            options and risks were discussed with the patient.                            All questions were answered, and informed consent                            was obtained. Prior Anticoagulants: The patient has                            taken no previous anticoagulant or antiplatelet                            agents. ASA Grade Assessment: II - A patient with                            mild systemic disease. After reviewing the risks  and benefits, the patient was deemed in                            satisfactory condition to undergo the procedure.                           After obtaining informed consent, the colonoscope                            was passed under direct vision. Throughout the                            procedure, the patient's blood pressure, pulse, and                            oxygen saturations were monitored continuously. The                             Colonoscope was introduced through the anus and                            advanced to the the ileocolonic anastomosis. The                            colonoscopy was performed without difficulty. The                            patient tolerated the procedure well. The quality                            of the bowel preparation was good. The rectum,                            surgical anastomosis were photographed. Scope In: 1:27:57 PM Scope Out: 1:50:50 PM Scope Withdrawal Time: 0 hours 19 minutes 14 seconds  Total Procedure Duration: 0 hours 22 minutes 53 seconds  Findings:                 The perianal and digital rectal examinations were                            normal.                           There was evidence of a prior end-to-side                            ileo-colonic anastomosis in the ascending colon.                            This was patent and was characterized by healthy                            appearing mucosa.  Multiple medium-mouthed diverticula were found in                            the transverse colon and left colon.                           A 3 mm polyp was found in the sigmoid colon. The                            polyp was sessile. The polyp was removed with a                            cold snare. Resection and retrieval were complete.                           A 3 mm polyp was found in the recto-sigmoid colon.                            The polyp was sessile. The polyp was removed with a                            cold snare. Resection and retrieval were complete.                           The colon was tortuous, with a sharply angulated                            turn in the left colon.                           Internal hemorrhoids were found during retroflexion.                           There was a extrinsic compression of the rectum                            possibly representing the prostate,  although                            prostate did not feel enlarged on DRE. The exam was                            otherwise without abnormality. Complications:            No immediate complications. Estimated blood loss:                            Minimal. Estimated Blood Loss:     Estimated blood loss was minimal. Impression:               - Patent end-to-side ileo-colonic anastomosis,                            characterized by healthy appearing mucosa.                           -  Diverticulosis in the transverse colon and in the                            left colon.                           - One 3 mm polyp in the sigmoid colon, removed with                            a cold snare. Resected and retrieved.                           - One 3 mm polyp at the recto-sigmoid colon,                            removed with a cold snare. Resected and retrieved.                           - Tortuous colon.                           - Internal hemorrhoids.                           - The examination was otherwise normal. Possible                            enlarged prostate endoscopically. Recommendation:           - Patient has a contact number available for                            emergencies. The signs and symptoms of potential                            delayed complications were discussed with the                            patient. Return to normal activities tomorrow.                            Written discharge instructions were provided to the                            patient.                           - Resume previous diet.                           - Continue present medications.                           - Await pathology results.                           - Anticipate repeat colonoscopy in  3 years Carlota Raspberry. Tyheim Vanalstyne, MD 03/27/2020 1:59:00 PM This report has been signed electronically.

## 2020-03-27 NOTE — Progress Notes (Signed)
Report to PACU, RN, vss, BBS= Clear.  

## 2020-03-27 NOTE — Patient Instructions (Signed)
Information on polyp, hemorrhoids and diverticulosis given to you today.  Await pathology results.  Anticipate repeat colonoscopy in 3 years.   YOU HAD AN ENDOSCOPIC PROCEDURE TODAY AT [facility]:   Refer to the procedure report that was given to you for any specific questions about what was found during the examination.  If the procedure report does not answer your questions, please call your gastroenterologist to clarify.  If you requested that your care partner not be given the details of your procedure findings, then the procedure report has been included in a sealed envelope for you to review at your convenience later.  YOU SHOULD EXPECT: Some feelings of bloating in the abdomen. Passage of more gas than usual.  Walking can help get rid of the air that was put into your GI tract during the procedure and reduce the bloating. If you had a lower endoscopy (such as a colonoscopy or flexible sigmoidoscopy) you may notice spotting of blood in your stool or on the toilet paper. If you underwent a bowel prep for your procedure, you may not have a normal bowel movement for a few days.  Please Note:  You might notice some irritation and congestion in your nose or some drainage.  This is from the oxygen used during your procedure.  There is no need for concern and it should clear up in a day or so.  SYMPTOMS TO REPORT IMMEDIATELY:   Following lower endoscopy (colonoscopy or flexible sigmoidoscopy):  Excessive amounts of blood in the stool  Significant tenderness or worsening of abdominal pains  Swelling of the abdomen that is new, acute  Fever of 100F or higher  For urgent or emergent issues, a gastroenterologist can be reached at any hour by calling [phone]. Do not use MyChart messaging for urgent concerns.    DIET:  We do recommend a small meal at first, but then you may proceed to your regular diet.  Drink plenty of fluids but you should avoid alcoholic beverages for  24 hours.  ACTIVITY:  You should plan to take it easy for the rest of today and you should NOT DRIVE or use heavy machinery until tomorrow (because of the sedation medicines used during the test).    FOLLOW UP: Our staff will call the number listed on your records 48-72 hours following your procedure to check on you and address any questions or concerns that you may have regarding the information given to you following your procedure. If we do not reach you, we will leave a message.  We will attempt to reach you two times.  During this call, we will ask if you have developed any symptoms of COVID 19. If you develop any symptoms (ie: fever, flu-like symptoms, shortness of breath, cough etc.) before then, please call [phone].  If you test positive for Covid 19 in the 2 weeks post procedure, please call and report this information to [place].    If any biopsies were taken you will be contacted by phone or by letter within the next 1-3 weeks.  Please call us at [phone] if you have not heard about the biopsies in 3 weeks.    SIGNATURES/CONFIDENTIALITY: You and/or your care partner have signed paperwork which will be entered into your electronic medical record.  These signatures attest to the fact that that the information above on your After Visit Summary has been reviewed and is understood.  Full responsibility of the confidentiality of this discharge information lies with  you and/or your care-partner.

## 2020-03-27 NOTE — Progress Notes (Signed)
VS by CW  Pt's states no medical or surgical changes since previsit or office visit.  

## 2020-03-31 ENCOUNTER — Telehealth: Payer: Self-pay | Admitting: *Deleted

## 2020-03-31 NOTE — Telephone Encounter (Signed)
  Follow up Call-  Call back number 03/27/2020 01/25/2019  Post procedure Call Back phone  # [phone]  Permission to leave phone message Yes Yes  Some recent data might be hidden     Patient questions:  Do you have a fever, pain , or abdominal swelling? No. Pain Score  0 *  Have you tolerated food without any problems? Yes.    Have you been able to return to your normal activities? Yes.    Do you have any questions about your discharge instructions: Diet   No. Medications  No. Follow up visit  No.  Do you have questions or concerns about your Care? No.  Actions: * If pain score is 4 or above: No action needed, pain <4.  1. Have you developed a fever since your procedure? no  2.   Have you had an respiratory symptoms (SOB or cough) since your procedure? no  3.   Have you tested positive for COVID 19 since your procedure no  4.   Have you had any family members/close contacts diagnosed with the COVID 19 since your procedure? no   If yes to any of these questions please route to Joylene John, RN and Joella Prince, RN

## 2020-08-03 ENCOUNTER — Telehealth: Payer: Self-pay | Admitting: Oncology

## 2020-08-03 NOTE — Telephone Encounter (Signed)
Calendar & letter has been mailed to the patient with updated address for appointments at [facility]  

## 2020-09-17 ENCOUNTER — Inpatient Hospital Stay: Payer: BC Managed Care – PPO

## 2020-09-17 ENCOUNTER — Inpatient Hospital Stay: Payer: BC Managed Care – PPO | Attending: Oncology | Admitting: Oncology

## 2020-09-17 ENCOUNTER — Other Ambulatory Visit: Payer: BC Managed Care – PPO

## 2020-09-21 ENCOUNTER — Telehealth: Payer: Self-pay | Admitting: Oncology

## 2020-09-21 ENCOUNTER — Encounter: Payer: Self-pay | Admitting: *Deleted

## 2020-09-21 NOTE — Progress Notes (Signed)
"  No show" for lab/6 month OV on 09/17/20. Scheduling message sent to get rescheduled.

## 2020-09-21 NOTE — Telephone Encounter (Signed)
Called pt per 5/16 sch msg - pt is aware of new scheduled appt for 6/13

## 2020-10-06 IMAGING — CT CT ANGIO CHEST
2 of 7 series · 16 of 46 positions shown · IV contrast (omnipaque)
Comparison: CT chest and abdomen dated 02/06/2019.

CLINICAL DATA: LEFT abdominal pain radiating into chest. Lower
abdominal pain starting last night. History of cancer and
chemotherapy yesterday. Shortness of breath.

EXAM:
CT ANGIOGRAPHY CHEST
CT ABDOMEN AND PELVIS WITH CONTRAST
TECHNIQUE: Multidetector CT imaging of the chest was performed using the
standard protocol during bolus administration of intravenous
contrast. Multiplanar CT image reconstructions and MIPs were
obtained to evaluate the vascular anatomy. Multidetector CT imaging
of the abdomen and pelvis was performed using the standard protocol
during bolus administration of intravenous contrast.
CONTRAST:  100mL OMNIPAQUE IOHEXOL 350 MG/ML SOLN

[Series 504: coronal mpr · coronal · 0.66mm/px · 3 of 121 slices shown]
[im 31/121  soft-tissue]
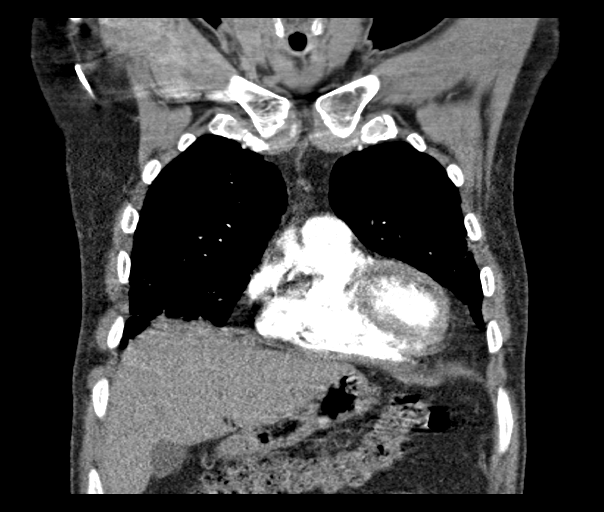
[im 61/121  soft-tissue]
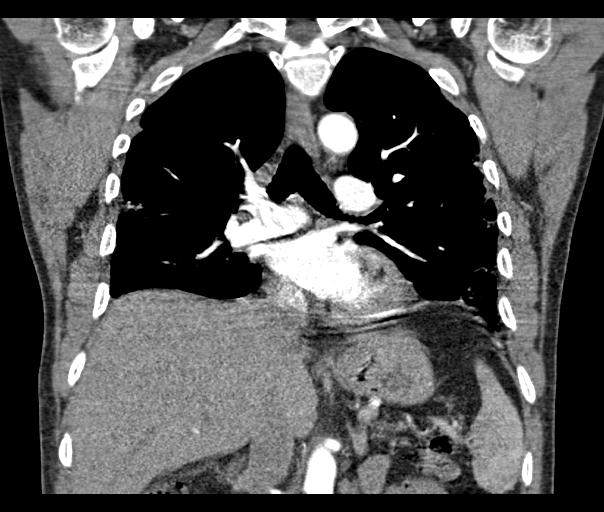
[im 91/121  soft-tissue]
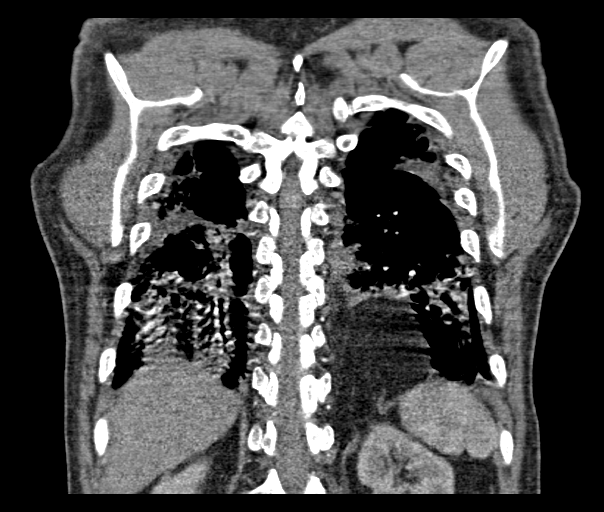

[Series 506: thins · axial · 0.80mm/px · z∈[-127,+149]mm · 13 of 316 slices shown]
[im 20/316  lung]
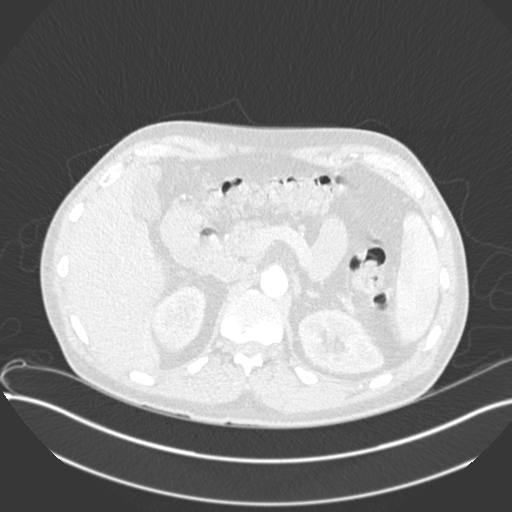
[im 40/316  soft-tissue]
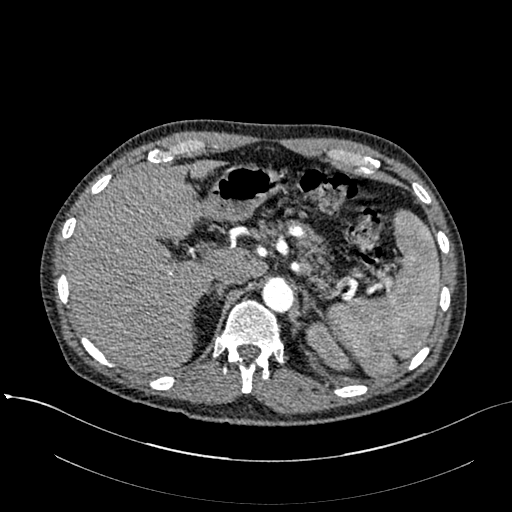
[im 60/316  lung]
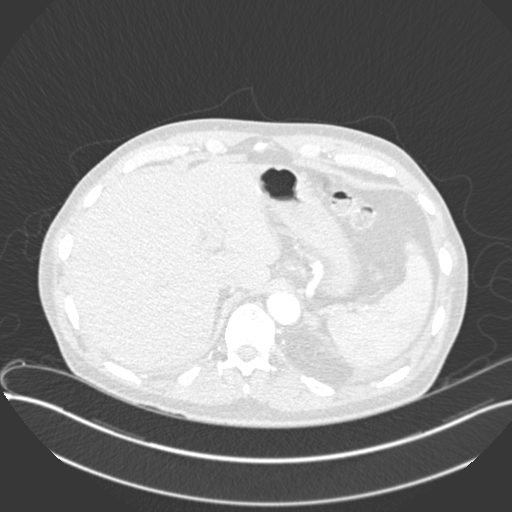
[im 99/316  soft-tissue]
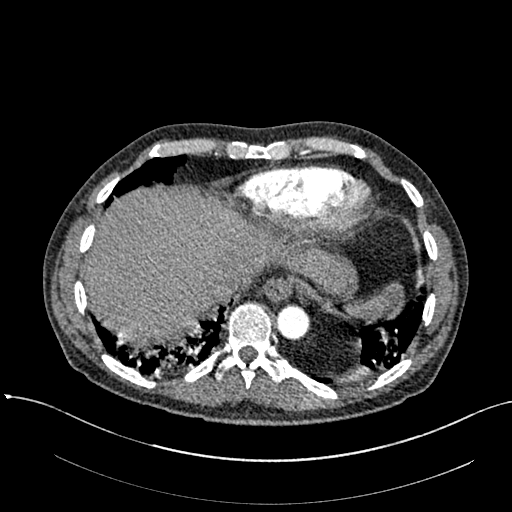
[im 119/316  lung]
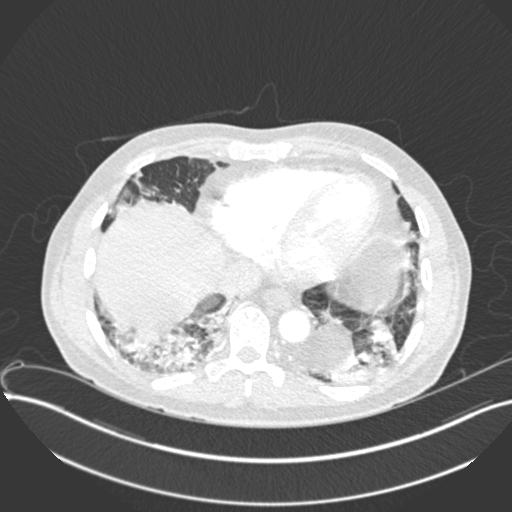
[im 138/316  soft-tissue]
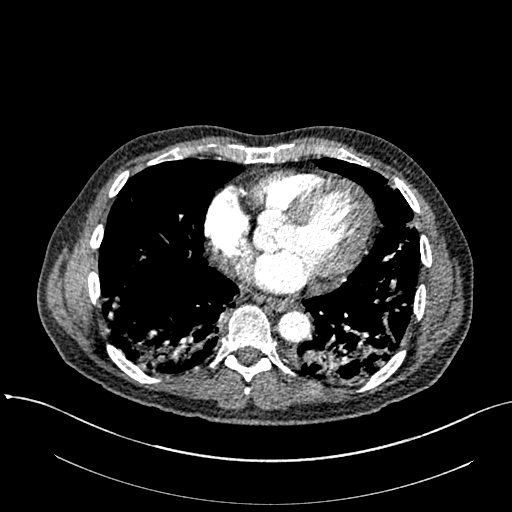
[im 158/316  lung]
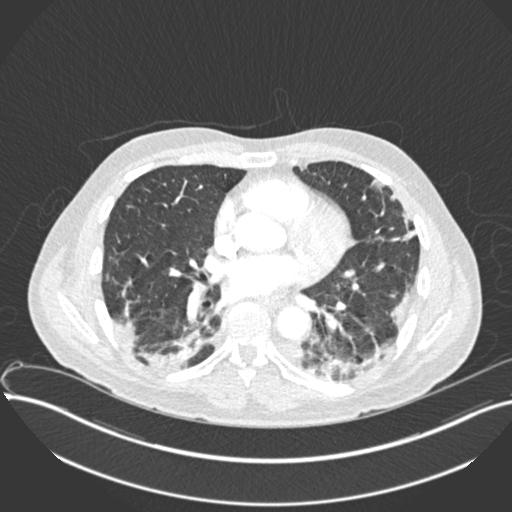
[im 178/316  soft-tissue]
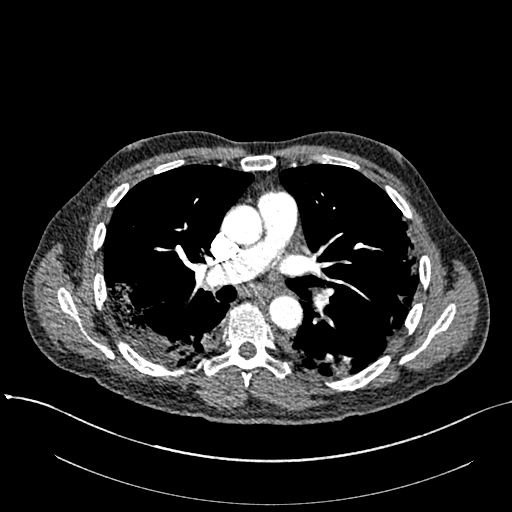
[im 197/316  lung]
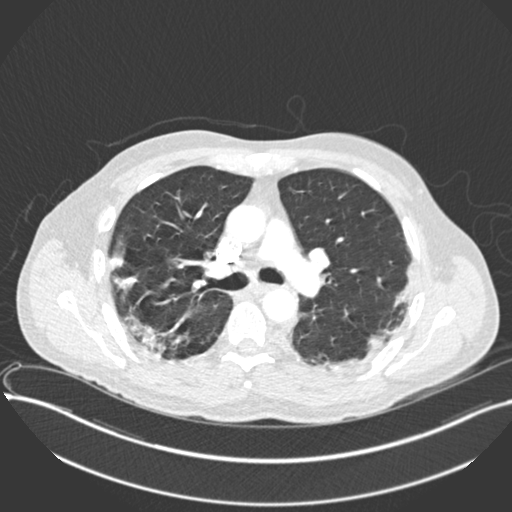
[im 217/316  soft-tissue]
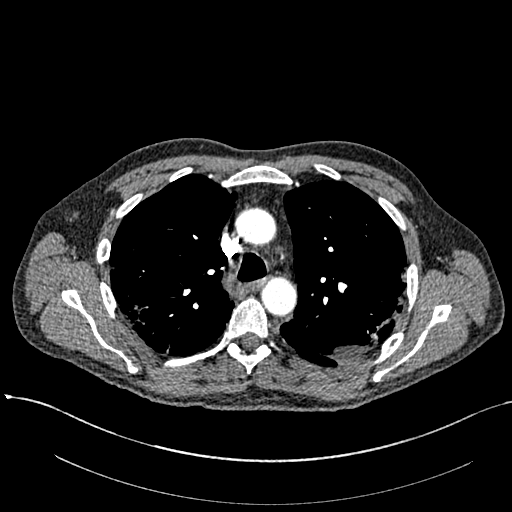
[im 256/316  lung]
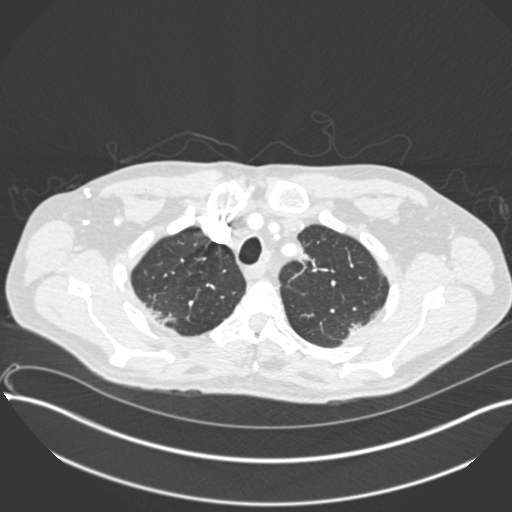
[im 276/316  soft-tissue]
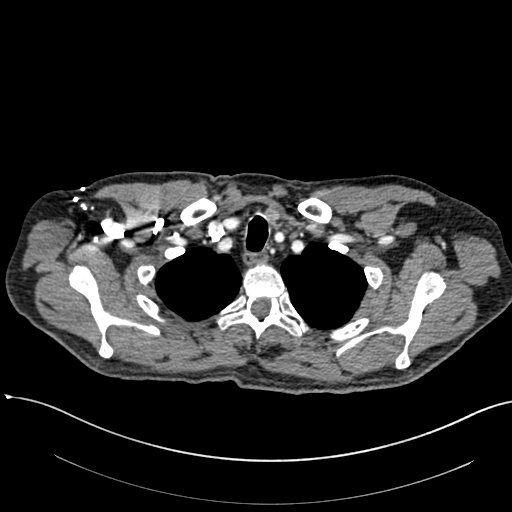
[im 296/316  lung]
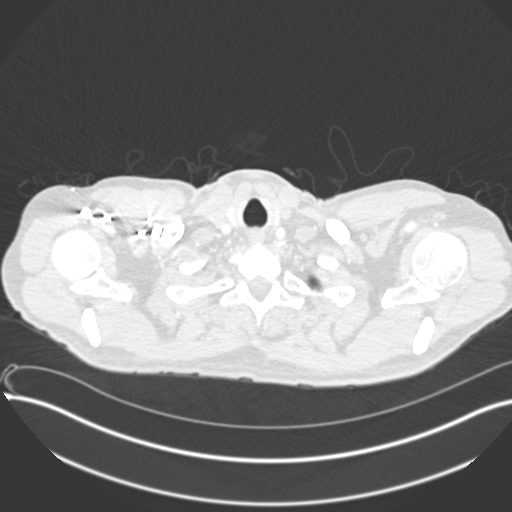

[16 of 46 positions shown; findings below may reference images not displayed]

FINDINGS: CTA CHEST FINDINGS

Cardiovascular: The majority of the peripheral segmental and
subsegmental pulmonary artery branches cannot be definitively
characterized due to patient breathing motion artifact. There is no
pulmonary embolism identified within the main, lobar or central
segmental pulmonary arteries bilaterally.

No thoracic aortic aneurysm or evidence of aortic dissection. No
pericardial effusion.

Mediastinum/Nodes: No mass or enlarged lymph nodes seen within the
mediastinum or perihilar regions. Esophagus appears normal. Trachea
and central bronchi are unremarkable.

Lungs/Pleura: New patchy peripheral consolidations throughout both
lungs, most confluent within the lower lobes bilaterally, some
components of ground-glass opacity. No pleural effusion or
pneumothorax. Incidental note made of a LEFT-sided Bochdalek's
hernia.

Musculoskeletal: Mild scoliosis of the thoracic spine. No acute or
suspicious osseous finding.

Review of the MIP images confirms the above findings.

CT ABDOMEN and PELVIS FINDINGS

Hepatobiliary: No focal liver abnormality is seen. No gallstones,
gallbladder wall thickening, or biliary dilatation.

Pancreas: Unremarkable. No pancreatic ductal dilatation or
surrounding inflammatory changes.

Spleen: Normal in size without focal abnormality.

Adrenals/Urinary Tract: Adrenal glands appear normal. Kidneys are
unremarkable without mass, stone or hydronephrosis. No perinephric
fluid. Bladder is decompressed limiting characterization of its
walls.

1 mm calcification within the LEFT lower pelvis, of uncertain
relationship to the distal LEFT ureter, but not seen on the earlier
CT and therefore concerning for a distal intraureteral stone. No
associated hydroureter or hydronephrosis.

Stomach/Bowel: No dilated large or small bowel loops. No evidence of
bowel wall inflammation. Enterocolic anastomosis within the RIGHT
abdomen, without obstruction inflammation or other surgical
complicating feature. Stomach is unremarkable, partially
decompressed.

Vascular/Lymphatic: Aortic atherosclerosis. No acute appearing
vascular abnormality. No enlarged lymph nodes seen.

Reproductive: Prostate gland is mildly prominent in size with
central dystrophic calcifications.

Other: No free fluid or abscess collection seen. No free
intraperitoneal air.

Musculoskeletal: Mild degenerative spondylosis of the lower lumbar
spine. No acute or suspicious osseous finding.

Small periumbilical abdominal wall hernia which contains fat only.
Bilateral inguinal hernias which contain fat only.

Review of the MIP images confirms the above findings.
IMPRESSION: 1. New patchy peripheral consolidations throughout both lungs, most
confluent within the lower lobes bilaterally, some components of
ground-glass opacity. Differential includes atypical pneumonias such
as viral or fungal, interstitial pneumonias, and mix of edema and
atelectasis. [id number] pneumonia can certainly have this appearance.
2. 1 mm calcification within the LEFT lower pelvis, of uncertain
relationship to the distal LEFT ureter, but not seen on the earlier
CT and therefore most likely a distal intra-ureteral stone. No
associated hydroureter or hydronephrosis.
3. No other acute findings within the abdomen or pelvis. No bowel
obstruction or evidence of bowel wall inflammation. No free fluid or
abscess collection. No free intraperitoneal air.

Aortic Atherosclerosis ([id number]).

## 2020-10-19 ENCOUNTER — Inpatient Hospital Stay: Payer: BC Managed Care – PPO | Admitting: Oncology

## 2020-10-19 ENCOUNTER — Inpatient Hospital Stay: Payer: BC Managed Care – PPO

## 2020-10-30 ENCOUNTER — Inpatient Hospital Stay (HOSPITAL_BASED_OUTPATIENT_CLINIC_OR_DEPARTMENT_OTHER): Payer: BC Managed Care – PPO | Admitting: Oncology

## 2020-10-30 ENCOUNTER — Other Ambulatory Visit: Payer: Self-pay

## 2020-10-30 ENCOUNTER — Inpatient Hospital Stay: Payer: BC Managed Care – PPO | Attending: Oncology

## 2020-10-30 VITALS — BP 184/86 | HR 68 | Temp 97.7°F | Resp 20 | Ht 70.0 in | Wt 186.6 lb

## 2020-10-30 DIAGNOSIS — C180 Malignant neoplasm of cecum: Secondary | ICD-10-CM

## 2020-10-30 LAB — CEA (ACCESS): CEA (CHCC): 1.62 ng/mL (ref 0.00–5.00)

## 2020-10-30 LAB — CEA (IN HOUSE-CHCC): CEA (CHCC-In House): 1.56 ng/mL (ref 0.00–5.00)

## 2020-10-30 NOTE — Progress Notes (Signed)
[facility] OFFICE PROGRESS NOTE   Diagnosis: Colon cancer  INTERVAL HISTORY:   Isaiah Gray returns as scheduled.  He feels well.  Good appetite.  Minimal neuropathy symptoms persist in the feet.  No difficulty with bowel function.  Objective:  Vital signs in last 24 hours:  Blood pressure (!) 184/86, pulse 68, temperature 97.7 F (36.5 C), temperature source Oral, resp. rate 20, height 5' 10"  (1.778 m), weight 186 lb 9.6 oz (84.6 kg), SpO2 100 %.    Lymphatics: No cervical, supraclavicular, axillary, or inguinal nodes Resp: Lungs clear bilaterally Cardio: Regular rate and rhythm GI: No mass, no hepatosplenomegaly, nontender Vascular: No leg edema  Skin: 1 cm round mobile cutaneous nodule at the left forearm  Portacath/PICC-without erythema  Lab Results:  Lab Results  Component Value Date   WBC 7.1 07/05/2019   HGB 12.6 (L) 07/05/2019   HCT 37.8 (L) 07/05/2019   MCV 93.3 07/05/2019   PLT 342 07/05/2019   NEUTROABS 4.0 07/05/2019    CMP  Lab Results  Component Value Date   NA 141 03/16/2020   K 4.3 03/16/2020   CL 108 03/16/2020   CO2 28 03/16/2020   GLUCOSE 81 03/16/2020   BUN 18 03/16/2020   CREATININE 1.13 03/16/2020   CALCIUM 9.3 03/16/2020   PROT 6.3 (L) 07/05/2019   ALBUMIN 3.2 (L) 07/05/2019   AST 31 07/05/2019   ALT 27 07/05/2019   ALKPHOS 129 (H) 07/05/2019   BILITOT 0.6 07/05/2019   GFRNONAA >60 03/16/2020   GFRAA >60 07/05/2019    Lab Results  Component Value Date   CEA1 1.40 03/16/2020     Medications: I have reviewed the patient's current medications.   Assessment/Plan: Adenocarcinoma of the cecum, stage IIIb (T3N1B)  Right hemicolectomy 03/15/2019 moderate-poorly differentiated, lymphovascular invasion present, 2/17 lymph nodes positive, no loss of mismatch repair protein expression, MSI-low (MSI low pattern also seen in adjacent normal tissue-possible polymorphism in normal DNA, single locus of instability  observed) Colonoscopy 01/25/2019-cecal mass, multiple polyps including the ascending, transverse, and sigmoid colon, pathology from the cecum-adenocarcinoma, pathology from the transverse and sigmoid polyps-tubular adenomas Tubular adenoma at the proximal ascending colon on the right colectomy specimen CTs 02/06/2019-mass at the medial wall of the cecum, 2 small ileocolic nodes measuring 5 mm, 4 mm irregular subpleural nodule in the anterior right middle lobe-favored to be a benign subpleural lymph node Elevated preoperative CEA, normal 04/05/2019 Cycle 1 CAPOX 04/10/2019 Cycle 2 CAPOX 05/01/2019 Cycle 3 CAPOX 05/22/2019, Xeloda dose reduced secondary to hand/foot syndrome and diarrhea, Xeloda discontinued 05/28/2019 secondary to diarrhea and COVID-19 infection Cycle 4 CAPOX 06/21/2019 CTs 06/22/2019-new patchy peripheral consolidations bilaterally, 1 mm calcification left lower pelvis-likely a distal ureteral stone CTs 03/17/2020-no metastatic disease; 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   Bilateral inguinal hernias on CT 02/06/19 5.   Family history of colon cancer 6.   Multiple tubular adenomas on the colonoscopy 01/25/2019 and a tubular adenoma on the right colon resection specimen 7.   Elevated liver enzymes-likely secondary to oxaliplatin 8.  Diarrhea, cough, dehydration 05/30/2019-referred to the emergency room and diagnosed with COVID-19 infection, admitted 06/02/2019 completed course of remdesivir and steroids 9.  Oxaliplatin neuropathy    Disposition: [name] remains in clinical remission from colon cancer.  We will follow-up on the CEA from today.  He will return for an office visit and restaging CTs  in November.  The mobile cutaneous nodule at the left forearm is likely a benign  cyst.    Betsy Coder, MD  10/30/2020  8:40 AM

## 2020-11-05 ENCOUNTER — Other Ambulatory Visit: Payer: BC Managed Care – PPO

## 2020-11-05 ENCOUNTER — Ambulatory Visit: Payer: BC Managed Care – PPO | Admitting: Oncology

## 2021-02-11 ENCOUNTER — Telehealth (HOSPITAL_COMMUNITY): Payer: Self-pay | Admitting: *Deleted

## 2021-02-11 DIAGNOSIS — R931 Abnormal findings on diagnostic imaging of heart and coronary circulation: Secondary | ICD-10-CM

## 2021-02-11 NOTE — Telephone Encounter (Signed)
Per Dr Haroldine Laws pt with h/o CAD, needs repeat Calcium Score, order placed.

## 2021-03-18 ENCOUNTER — Ambulatory Visit (INDEPENDENT_AMBULATORY_CARE_PROVIDER_SITE_OTHER)
Admission: RE | Admit: 2021-03-18 | Discharge: 2021-03-18 | Disposition: A | Discharge status: Home / Self Care | Payer: Self-pay | Source: Ambulatory Visit | Attending: Internal Medicine | Admitting: Internal Medicine

## 2021-03-18 ENCOUNTER — Other Ambulatory Visit: Payer: Self-pay

## 2021-03-18 DIAGNOSIS — R931 Abnormal findings on diagnostic imaging of heart and coronary circulation: Secondary | ICD-10-CM

## 2021-03-22 ENCOUNTER — Other Ambulatory Visit: Payer: Self-pay

## 2021-03-22 ENCOUNTER — Ambulatory Visit (HOSPITAL_BASED_OUTPATIENT_CLINIC_OR_DEPARTMENT_OTHER)
Admission: RE | Admit: 2021-03-22 | Discharge: 2021-03-22 | Disposition: A | Discharge status: Home / Self Care | Payer: BC Managed Care – PPO | Source: Ambulatory Visit | Attending: Oncology | Admitting: Oncology

## 2021-03-22 ENCOUNTER — Inpatient Hospital Stay: Payer: BC Managed Care – PPO | Attending: Oncology

## 2021-03-22 ENCOUNTER — Other Ambulatory Visit (HOSPITAL_BASED_OUTPATIENT_CLINIC_OR_DEPARTMENT_OTHER): Payer: BC Managed Care – PPO

## 2021-03-22 DIAGNOSIS — C180 Malignant neoplasm of cecum: Secondary | ICD-10-CM | POA: Diagnosis present

## 2021-03-22 DIAGNOSIS — Z85038 Personal history of other malignant neoplasm of large intestine: Secondary | ICD-10-CM | POA: Insufficient documentation

## 2021-03-22 DIAGNOSIS — G620 Drug-induced polyneuropathy: Secondary | ICD-10-CM | POA: Insufficient documentation

## 2021-03-22 LAB — BASIC METABOLIC PANEL - CANCER CENTER ONLY
Anion gap: 6 (ref 5–15)
BUN: 22 mg/dL (ref 8–23)
CO2: 29 mmol/L (ref 22–32)
Calcium: 9.6 mg/dL (ref 8.9–10.3)
Chloride: 103 mmol/L (ref 98–111)
Creatinine: 1.24 mg/dL (ref 0.61–1.24)
GFR, Estimated: >60 mL/min (ref >60)
Glucose, Bld: 104 mg/dL — ABNORMAL HIGH (ref 70–99)
Potassium: 4.1 mmol/L (ref 3.5–5.1)
Sodium: 138 mmol/L (ref 135–145)

## 2021-03-22 LAB — CEA (ACCESS): CEA (CHCC): 2.02 ng/mL (ref 0.00–5.00)

## 2021-03-22 MED ORDER — IOHEXOL 300 MG/ML  SOLN
85.0000 mL | Freq: Once | INTRAMUSCULAR | Status: AC | PRN
  Administered 2021-03-22: 85 mL via INTRAVENOUS

## 2021-03-24 ENCOUNTER — Other Ambulatory Visit: Payer: Self-pay

## 2021-03-24 ENCOUNTER — Inpatient Hospital Stay (HOSPITAL_BASED_OUTPATIENT_CLINIC_OR_DEPARTMENT_OTHER): Payer: BC Managed Care – PPO | Admitting: Oncology

## 2021-03-24 ENCOUNTER — Encounter: Payer: Self-pay | Admitting: Oncology

## 2021-03-24 ENCOUNTER — Other Ambulatory Visit (HOSPITAL_BASED_OUTPATIENT_CLINIC_OR_DEPARTMENT_OTHER): Payer: Self-pay

## 2021-03-24 VITALS — BP 159/90 | HR 87 | Temp 98.7°F | Resp 20 | Ht 70.0 in | Wt 187.0 lb

## 2021-03-24 DIAGNOSIS — Z85038 Personal history of other malignant neoplasm of large intestine: Secondary | ICD-10-CM | POA: Diagnosis present

## 2021-03-24 DIAGNOSIS — C180 Malignant neoplasm of cecum: Secondary | ICD-10-CM | POA: Diagnosis not present

## 2021-03-24 DIAGNOSIS — G620 Drug-induced polyneuropathy: Secondary | ICD-10-CM | POA: Diagnosis not present

## 2021-03-24 MED ORDER — INFLUENZA VAC SPLIT QUAD 0.5 ML IM SUSY
PREFILLED_SYRINGE | INTRAMUSCULAR | 0 refills | Status: DC
  Filled 2021-03-24: qty 0.5, 1d supply, fill #0

## 2021-03-24 NOTE — Progress Notes (Signed)
[facility] OFFICE PROGRESS NOTE   Diagnosis: Colon cancer  INTERVAL HISTORY:   Isaiah Gray returns as scheduled.  He feels well.  He is working.  No difficulty with bowel function.  Good appetite.  He has mild numbness at the balls of the feet.  No other neuropathy symptoms.  He has occasional tingling in the left arm when eating.  He plans to see Dr. Jeffie Pollock to discuss this and a recent cardiac calcium score.  Objective:  Vital signs in last 24 hours:  Blood pressure (!) 159/90, pulse 87, temperature 98.7 F (37.1 C), temperature source Oral, resp. rate 20, height 5' 10"  (1.778 m), weight 187 lb (84.8 kg), SpO2 100 %.   Lymphatics: No cervical, supraclavicular, axillary, or inguinal nodes Resp: Lungs clear bilaterally Cardio: Regular rate and rhythm GI: No hepatosplenomegaly, nontender, no mass Vascular: No leg edema   Lab Results:  Lab Results  Component Value Date   WBC 7.1 07/05/2019   HGB 12.6 (L) 07/05/2019   HCT 37.8 (L) 07/05/2019   MCV 93.3 07/05/2019   PLT 342 07/05/2019   NEUTROABS 4.0 07/05/2019    CMP  Lab Results  Component Value Date   NA 138 03/22/2021   K 4.1 03/22/2021   CL 103 03/22/2021   CO2 29 03/22/2021   GLUCOSE 104 (H) 03/22/2021   BUN 22 03/22/2021   CREATININE 1.24 03/22/2021   CALCIUM 9.6 03/22/2021   PROT 6.3 (L) 07/05/2019   ALBUMIN 3.2 (L) 07/05/2019   AST 31 07/05/2019   ALT 27 07/05/2019   ALKPHOS 129 (H) 07/05/2019   BILITOT 0.6 07/05/2019   GFRNONAA >60 03/22/2021   GFRAA >60 07/05/2019    Lab Results  Component Value Date   CEA1 1.56 10/30/2020   CEA 2.02 03/22/2021    Lab Results  Component Value Date   INR 1.0 03/12/2019   LABPROT 13.4 03/12/2019    Imaging:  CT CHEST ABDOMEN PELVIS W CONTRAST  Result Date: 03/22/2021 CLINICAL DATA:  65 year old male with history of colorectal cancer status post surgical resection. Follow-up study. EXAM: CT CHEST, ABDOMEN, AND PELVIS WITH CONTRAST  TECHNIQUE: Multidetector CT imaging of the chest, abdomen and pelvis was performed following the standard protocol during bolus administration of intravenous contrast. CONTRAST:  45m OMNIPAQUE IOHEXOL 300 MG/ML  SOLN COMPARISON:  CT the chest, abdomen and pelvis 03/17/2020. FINDINGS: CT CHEST FINDINGS Cardiovascular: Heart size is normal. There is no significant pericardial fluid, thickening or pericardial calcification. There is aortic atherosclerosis, as well as atherosclerosis of the great vessels of the mediastinum and the coronary arteries, including calcified atherosclerotic plaque in the right coronary artery. Mediastinum/Nodes: No pathologically enlarged mediastinal or hilar lymph nodes. Esophagus is unremarkable in appearance. No axillary lymphadenopathy. Lungs/Pleura: No definite suspicious appearing pulmonary nodules or masses are noted. No acute consolidative airspace disease. No pleural effusions. Left-sided Bochdalek's hernia incidentally noted. Widespread areas of septal thickening, thickening of the peribronchovascular interstitium and regional architectural distortion are again noted throughout the lungs bilaterally, most evident throughout the mid to lower lungs. Many areas with relative sparing of the immediate subpleural interstitium are noted. No honeycombing. No definite progression compared to the prior study. Musculoskeletal: There are no aggressive appearing lytic or blastic lesions noted in the visualized portions of the skeleton. CT ABDOMEN PELVIS FINDINGS Hepatobiliary: No suspicious cystic or solid hepatic lesions. No intra or extrahepatic biliary ductal dilatation. Gallbladder is nearly decompressed, but otherwise unremarkable in appearance. Pancreas: No pancreatic mass. No pancreatic ductal dilatation.  No pancreatic or peripancreatic fluid collections or inflammatory changes. Spleen: No priors. Adrenals/Urinary Tract: Bilateral kidneys and adrenal glands are normal in appearance. No  hydroureteronephrosis. Urinary bladder is normal in appearance. Stomach/Bowel: Normal appearance of the stomach. No pathologic dilatation of small bowel or colon. Patient is status post cecal resection with ileo colonic anastomosis. Notably, the neo terminal ileum appears diffusely thickened, without definite surrounding inflammatory changes in the adjacent fat. This is best appreciated on axial images 79-91 of series 2. Vascular/Lymphatic: Aortic atherosclerosis, without evidence of aneurysm or dissection in the abdominal or pelvic vasculature. No lymphadenopathy noted in the abdomen or pelvis. Reproductive: Prostate gland and seminal vesicles are unremarkable in appearance. Other: No significant volume of ascites.  No pneumoperitoneum. Musculoskeletal: There are no aggressive appearing lytic or blastic lesions noted in the visualized portions of the skeleton. IMPRESSION: 1. Status post cecal resection with new mural thickening of the neo terminal ileum on today's examination. This is a definite change from the prior examination. This may simply represent mural thickening from backwash ileitis, however, there are no overt surrounding inflammatory changes in the adjacent fat. The possibility of neoplastic infiltration of the neo terminal ileum is not entirely excluded. Further clinical evaluation is recommended, and close attention on follow-up imaging is strongly recommended. 2. No suspicious findings to suggest metastatic disease elsewhere in the chest, abdomen or pelvis. 3. Chronic changes in the lungs once again favored to reflect post infectious/post inflammatory fibrosis as can be seen with cryptogenic organizing pneumonia (COP) in the setting of prior COVID infection. 4. Aortic atherosclerosis, in addition to right coronary artery disease. Please note that although the presence of coronary artery calcium documents the presence of coronary artery disease, the severity of this disease and any potential stenosis  cannot be assessed on this non-gated CT examination. Assessment for potential risk factor modification, dietary therapy or pharmacologic therapy may be warranted, if clinically indicated. Electronically Signed   By: Vinnie Langton M.D.   On: 03/22/2021 11:20    Medications: I have reviewed the patient's current medications.   Assessment/Plan: Adenocarcinoma of the cecum, stage IIIb (T3N1B)  Right hemicolectomy 03/15/2019 moderate-poorly differentiated, lymphovascular invasion present, 2/17 lymph nodes positive, no loss of mismatch repair protein expression, MSI-low (MSI low pattern also seen in adjacent normal tissue-possible polymorphism in normal DNA, single locus of instability observed) Colonoscopy 01/25/2019-cecal mass, multiple polyps including the ascending, transverse, and sigmoid colon, pathology from the cecum-adenocarcinoma, pathology from the transverse and sigmoid polyps-tubular adenomas Tubular adenoma at the proximal ascending colon on the right colectomy specimen CTs 02/06/2019-mass at the medial wall of the cecum, 2 small ileocolic nodes measuring 5 mm, 4 mm irregular subpleural nodule in the anterior right middle lobe-favored to be a benign subpleural lymph node Elevated preoperative CEA, normal 04/05/2019 Cycle 1 CAPOX 04/10/2019 Cycle 2 CAPOX 05/01/2019 Cycle 3 CAPOX 05/22/2019, Xeloda dose reduced secondary to hand/foot syndrome and diarrhea, Xeloda discontinued 05/28/2019 secondary to diarrhea and COVID-19 infection Cycle 4 CAPOX 06/21/2019 CTs 06/22/2019-new patchy peripheral consolidations bilaterally, 1 mm calcification left lower pelvis-likely a distal ureteral stone CTs 03/17/2020-no metastatic disease; 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   Bilateral inguinal hernias on CT 02/06/19 5.   Family history of colon cancer 6.   Multiple tubular adenomas  on the colonoscopy 01/25/2019 and a tubular adenoma on the right colon resection specimen 7.   Elevated liver enzymes-likely secondary to oxaliplatin 8.  Diarrhea, cough, dehydration 05/30/2019-referred to the emergency room and diagnosed with COVID-19 infection, admitted 06/02/2019 completed course of remdesivir and steroids 9.  Oxaliplatin neuropathy     Disposition: Mr. Mimbs is in clinical remission from colon cancer.  I suspect the CT finding at the neoterminal ileum is benign.  I will present his case at the GI tumor conference and ask Dr. Havery Moros to review the CT.  Mr. Rieger will return for an office visit and CEA in 6 months.  Betsy Coder, MD  03/24/2021  8:41 AM

## 2021-03-31 ENCOUNTER — Other Ambulatory Visit: Payer: Self-pay

## 2021-03-31 NOTE — Progress Notes (Signed)
The proposed treatment discussed in conference is for discussion purpose only and is not a binding recommendation.  The patients have not been physically examined, or presented with their treatment options.  Therefore, final treatment plans cannot be decided.  

## 2021-04-06 ENCOUNTER — Ambulatory Visit (INDEPENDENT_AMBULATORY_CARE_PROVIDER_SITE_OTHER): Payer: BC Managed Care – PPO | Admitting: Pulmonary Disease

## 2021-04-06 ENCOUNTER — Other Ambulatory Visit: Payer: Self-pay

## 2021-04-06 ENCOUNTER — Encounter: Payer: Self-pay | Admitting: Pulmonary Disease

## 2021-04-06 ENCOUNTER — Encounter: Payer: Self-pay | Admitting: *Deleted

## 2021-04-06 VITALS — BP 146/84 | HR 78 | Temp 97.9°F | Ht 70.0 in | Wt 191.4 lb

## 2021-04-06 DIAGNOSIS — U099 Post covid-19 condition, unspecified: Secondary | ICD-10-CM

## 2021-04-06 DIAGNOSIS — J849 Interstitial pulmonary disease, unspecified: Secondary | ICD-10-CM

## 2021-04-06 NOTE — Patient Instructions (Signed)
We will check some basic labs including ANA, rheumatoid factor, CCP today Schedule PFTs and follow-up in 1 to 2 months.

## 2021-04-06 NOTE — Progress Notes (Signed)
Isaiah Gray    [id number]    [birth year]  Primary Care Physician:Weaver, Quin Hoop., MD  Referring Physician: Derrill Center., MD [address] [place],  [place] [address]  Chief complaint:   Consult for abnormal CT scan  HPI: 65 year old with history of hypertension, cecal cancer, prior COVID infection Referred for abnormal CT chest and coronaries showing interstitial lung disease  History notable for cecal cancer in late 2020 treated with surgery and chemotherapy.  He was hospitalized for COVID-19 in January 2021, treated with high flow nasal cannula, remdesivir, steroids.  CTA around that time showed patchy consolidations mostly in the lower lobes consistent with COVID-19 infection  Post infection he feels that he is recovered back to baseline.  Has returned to work and continues to be active More recent CTs show bibasal interstitial lung disease and has been referred here for further evaluation   Pets: Dogs Occupation: Works in Dentist Exposures: No significant exposures.  No mold, hot tub, Jacuzzi.  No feather pillows or comforter Smoking history: No tobacco use.  Smokes occasional marijuana Travel history: No significant travel history Relevant family history: No family history of lung disease   Outpatient Encounter Medications as of 04/06/2021  Medication Sig   aspirin EC 81 MG tablet Take 81 mg by mouth daily.    losartan (COZAAR) 50 MG tablet Take 1 tablet (50 mg total) by mouth daily. FUTURE REFILLS PER PRIMARY CARE MD   omeprazole (PRILOSEC) 20 MG capsule Take 20 mg by mouth daily.   sildenafil (REVATIO) 20 MG tablet Take 20 mg by mouth daily as needed (ED).   influenza vac split quadrivalent PF (FLUARIX) 0.5 ML injection Inject into the muscle.   No facility-administered encounter medications on file as of 04/06/2021.    Allergies as of 04/06/2021   (No Known Allergies)    Past Medical History:  Diagnosis Date   Cataract 2015   right  ecl with lens inplant   Cataract 2021   LEFT eye sx   COVID 05/2019   GERD (gastroesophageal reflux disease)    on meds   HTN (hypertension)    on meds   Pneumonia due to COVID-19 virus 05/2019   Primary cancer of cecum ([place]) dx'd 01/25/19    Past Surgical History:  Procedure Laterality Date   APPENDECTOMY  03/2019   COLONOSCOPY  2020   cecum CA dx-multiple polyps   ENDOSCOPIC VEIN LASER TREATMENT Bilateral 2020   legs    INGUINAL HERNIA REPAIR Right    age 52   LAPAROSCOPIC PARTIAL COLECTOMY Right 03/15/2019   Procedure: LAPAROSCOPIC RIGHT HEMICOLECTOMY;  Surgeon: Ileana Roup, MD;  Location: [facility];  Service: General;  Laterality: Right;   MENISCUS REPAIR Bilateral    POLYPECTOMY  2020   mulitple polyps/cecum CA   VARICOSE VEIN SURGERY Right     Family History  Problem Relation Age of Onset   Hypertension Mother    Colon polyps Mother    Hypertension Father    Colon cancer Maternal Grandmother 72   Colon polyps Maternal Grandmother    Cancer Paternal Grandmother        ? intestinal   Esophageal cancer Neg Hx    Stomach cancer Neg Hx    Rectal cancer Neg Hx     Social History   Socioeconomic History   Marital status: Married    Spouse name: Not on file   Number of children: 2  Years of education: Not on file   Highest education level: Not on file  Occupational History   Occupation: Cabin crew  Tobacco Use   Smoking status: Never   Smokeless tobacco: Never  Vaping Use   Vaping Use: Never used  Substance and Sexual Activity   Alcohol use: Yes    Comment: weekends   Drug use: Yes    Types: Marijuana    Comment: 03-12-2019 , last use x 1 month ago    Sexual activity: Not on file  Other Topics Concern   Not on file  Social History Narrative   Not on file   Social Determinants of Health   Financial Resource Strain: Not on file  Food Insecurity: Not on file  Transportation Needs: Not on file  Physical Activity: Not on file  Stress: Not on  file  Social Connections: Not on file  Intimate Partner Violence: Not on file    Review of systems: Review of Systems  Constitutional: Negative for fever and chills.  HENT: Negative.   Eyes: Negative for blurred vision.  Respiratory: as per HPI  Cardiovascular: Negative for chest pain and palpitations.  Gastrointestinal: Negative for vomiting, diarrhea, blood per rectum. Genitourinary: Negative for dysuria, urgency, frequency and hematuria.  Musculoskeletal: Negative for myalgias, back pain and joint pain.  Skin: Negative for itching and rash.  Neurological: Negative for dizziness, tremors, focal weakness, seizures and loss of consciousness.  Endo/Heme/Allergies: Negative for environmental allergies.  Psychiatric/Behavioral: Negative for depression, suicidal ideas and hallucinations.  All other systems reviewed and are negative.  Physical Exam: Blood pressure (!) 146/84, pulse 78, temperature 97.9 F (36.6 C), temperature source Oral, height 5\' 10"  (1.778 m), weight 191 lb 6.4 oz (86.8 kg), SpO2 98 %. Gen:      No acute distress HEENT:  EOMI, sclera anicteric Neck:     No masses; no thyromegaly Lungs:    Clear to auscultation bilaterally; normal respiratory effort CV:         Regular rate and rhythm; no murmurs Abd:      + bowel sounds; soft, non-tender; no palpable masses, no distension Ext:    No edema; adequate peripheral perfusion Skin:      Warm and dry; no rash Neuro: alert and oriented x 3 Psych: normal mood and affect  Data Reviewed: Imaging: CT chest 02/06/2019-visualized lungs did not show any interstitial abnormality CTA 06/22/2019-peripheral patchy consolidation confluent in the lower lobes bilaterally CT chest lung 03/17/20-basilar predominant fibrotic changes CT chest 03/22/2021-chronic lower lobe changes of septal thickening, scarring with subpleural sparing.  I have reviewed the images personally.  PFTs:  Labs:  Assessment:  Evaluation for interstitial lung  disease, post-COVID ILD I have reviewed his CT scans which show a pattern of scarring in the lung consistent with post COVID-19.  Although this is basilar predominant he does not have characteristic findings of UIP or probable UIP as the subpleural scarring.  These changes came on after his hospitalization for COVID-19 as CT of the chest a few months prior to that showed clear lungs  Overall they have remained stable on serial CT scans and we can continue monitoring. No acute interventions needed at this time I will get some baseline CTD labs for record Schedule PFTs and follow-up in clinic   Plan/Recommendations: ANA, rheumatoid factor, CCP PFTs  Marshell Garfinkel MD [facility] 04/06/2021, 9:33 AM  CC: Derrill Center., MD

## 2021-04-06 NOTE — Progress Notes (Signed)
PATIENT NAVIGATOR PROGRESS NOTE  Name: ARES CARDOZO Date: 04/06/2021 MRN: [id number]  DOB: January 13, 1956   Reason for visit:  Phone call with recommendations per Dr Benay Spice  Comments:  Called and spoke with Mr Tremblay regarding recommendations from Dr Benay Spice regarding Ilieum wall thickening felt to be a benign finding. The recommendations of repeat colonoscopy with Dr Havery Moros or CT enterography. Patient stated that since it is felt to be benign he would prefer surveillance and come to clinic with Dr Benay Spice in [id number]. Encouraged pt to call with any issues or questions in the interim    Time spent counseling/coordinating care: 15-30 minutes

## 2021-04-07 LAB — ANA: Anti Nuclear Antibody (ANA): NEGATIVE

## 2021-04-07 LAB — CYCLIC CITRUL PEPTIDE ANTIBODY, IGG: Cyclic Citrullin Peptide Ab: <16 UNITS

## 2021-04-07 LAB — RHEUMATOID FACTOR: Rheumatoid fact SerPl-aCnc: <14 IU/mL (ref <14)

## 2021-06-01 ENCOUNTER — Ambulatory Visit: Payer: BC Managed Care – PPO | Admitting: Pulmonary Disease

## 2021-09-02 ENCOUNTER — Encounter: Payer: Self-pay | Admitting: Oncology

## 2021-09-09 ENCOUNTER — Ambulatory Visit (INDEPENDENT_AMBULATORY_CARE_PROVIDER_SITE_OTHER): Payer: Medicare Other | Admitting: Nurse Practitioner

## 2021-09-09 ENCOUNTER — Encounter: Payer: Self-pay | Admitting: Oncology

## 2021-09-09 ENCOUNTER — Encounter: Payer: Self-pay | Admitting: Nurse Practitioner

## 2021-09-09 VITALS — BP 120/72 | HR 72 | Ht 70.0 in | Wt 187.2 lb

## 2021-09-09 DIAGNOSIS — Z85038 Personal history of other malignant neoplasm of large intestine: Secondary | ICD-10-CM | POA: Diagnosis not present

## 2021-09-09 DIAGNOSIS — K219 Gastro-esophageal reflux disease without esophagitis: Secondary | ICD-10-CM

## 2021-09-09 DIAGNOSIS — R9389 Abnormal findings on diagnostic imaging of other specified body structures: Secondary | ICD-10-CM

## 2021-09-09 MED ORDER — PLENVU 140 G PO SOLR: 1.0000 | Freq: Once | ORAL | 0 refills | Status: AC

## 2021-09-09 MED ORDER — PLENVU 140 G PO SOLR: 1.0000 | Freq: Once | ORAL | 0 refills | Status: DC

## 2021-09-09 NOTE — Progress Notes (Signed)
Agree with assessment and plan as outlined.  ?Would proceed with colonoscopy to evaluate CT findings which show ileal thickening - I don't think ileum was seen on the last exam. Can do both EGD and colonoscopy ?

## 2021-09-09 NOTE — Patient Instructions (Signed)
You have been scheduled for an endoscopy and colonoscopy. Please follow the written instructions given to you at your visit today. ?Please pick up your prep supplies at the pharmacy within the next 1-3 days. ?If you use inhalers (even only as needed), please bring them with you on the day of your procedure.  ? ?We have sent the following medications to your pharmacy for you to pick up at your convenience:   Plenvu Colon Prep ? ?Due to recent changes in healthcare laws, you may see the results of your imaging and laboratory studies on MyChart before your provider has had a chance to review them.  We understand that in some cases there may be results that are confusing or concerning to you. Not all laboratory results come back in the same time frame and the provider may be waiting for multiple results in order to interpret others.  Please give [place] 48 hours in order for your provider to thoroughly review all the results before contacting the office for clarification of your results.   ? ?If you are age 65 or older, your body mass index should be between 23-30. Your Body mass index is 26.86 kg/m?Marland Kitchen If this is out of the aforementioned range listed, please consider follow up with your Primary Care Provider. ? ?If you are age 18 or younger, your body mass index should be between 19-25. Your Body mass index is 26.86 kg/m?Marland Kitchen If this is out of the aformentioned range listed, please consider follow up with your Primary Care Provider.  ? ?________________________________________________________ ? ?The [facility] providers would like to encourage you to use [facility] to communicate with providers for non-urgent requests or questions.  Due to long hold times on the telephone, sending your provider a message by [facility] may be a faster and more efficient way to get a response.  Please allow 48 business hours for a response.  Please remember that this is for non-urgent requests.  ?_______________________________________________________   ? ?I appreciate the  opportunity to care for you ? ?Thank You  ? ?[place] ?

## 2021-09-09 NOTE — Progress Notes (Signed)
? ?Assessment  ? ?Patient Profile:  ?Isaiah Gray is a 66 y.o. male known to Dr. Havery Moros with a past medical history of GERD, HTN, diverticulosis , colon cancer s/p R hemicolectomy and chemotherapy in late 2020.  Additional medical history as listed in [place] . ? ?History of colon cancer, s/p R hemicolectomy and chemotherapy in 2020.  ?No recurrence of polyps nor cancer on last colonoscopy in Nov 2021.  ? ?Abnormal CTAP w/ contrast November 2022 ?The neo terminal ileum appears diffusely thickened, without definite surrounding inflammatory changes in the adjacent fat. Patient tell me that Oncology thought he should have another colonoscopy to evaluate these findings ? ?GERD.  ?Having recurrent discomfort under right shoulder blade which he had before with GERD. PCP recently increased PPI dose.  ? ?Plan:   ? ?Change timing of Prilosec to 30 mintutes BEFORE breakfast. ?Will schedule for EGD to evaluate the pain under right shoulder blade. Though he has had this pain before and relates it to GERD the symptoms are atypical.  If EGD is negative and pain persists consider RUQ [place]. The risks and benefits of EGD with possible biopsies were discussed with the patient who agrees to proceed.  ?Continue the higher dose of Prilosec ( 40 mg daily) for now.  ?Will schedule for colonoscopy though he is isn't due for surveillance purposes until November 2024. I told him that I would check with Dr. Havery Moros to make sure he felt another colonoscopy was warranted right now ? ? ?History of present illness: ? ?Chief Complaint : pain under right shoulder blade like before with GERD was active ? ?Ivy is here  for recurrent GERD symptoms and to discuss having another colonoscopy. He tells me that his last CT scan showed some colon abnormalities and Oncology mentioned that he should possibly have another colonoscopy.  ? ?PCP recently increased Prilosec from 20 mg to 40 mg daily in am but after breakfast. Not having heartburn or  regurgitation unless he has consumed etoh. No chest pain. However, he has been having recurrent mild discomfort below right shoulder blade ( he had this in 2020).   The discomfort is most noticeable in the evening a few hours after dinner. No associated nausea / vomiting. He has cut back on caffeine but does eat a lot of chocolate ? ?Cesar's bowel movements are normal. He has seen any blood in his stool. He is eating well. Weight is stable.  ? ?PCP recently increased losartan and BP has since improved.  ? ?Previous GI Evaluations: ?Endoscopies:  ?Nov 2021 surveillance colonoscopy  ?-Patent end-to-side ileo-colonic anastomosis, characterized by healthy appearing mucosa. ?- Diverticulosis in the transverse colon and in the left colon. ?- One 3 mm polyp in the sigmoid colon, removed with a cold snare. Resected and retrieved. ?- One 3 mm polyp at the recto-sigmoid colon, removed with a cold snare. Resected and retrieved. ?- Tortuous colon. ?- Internal hemorrhoids. ?- The examination was otherwise normal. Possible enlarged prostate endoscopically ? ?Polyps - hyperplastic ?Next surveillance colonoscopy in 3 years ? ?September 2020 colonoscopy and EGD ?-The examined portion of the ileum was normal. ?- One large polyp in the cecum overlying the appendiceal orifice, described as above.Biopsied. ?- One 20 to 30 mm polyp in the ascending colon. Not attempted to be removed today given findings in the cecum ?- Three 3 to 8 mm polyps in the transverse colon, removed with a cold snare. Resected and retrieved. ?- Three 3 to 6 mm polyps in the sigmoid colon,  removed with a cold snare. Resected and retrieved. ?- Diverticulosis in the sigmoid colon. ?- The examination was otherwise normal. ? ?EGD ?-Esophagogastric landmarks identified. ?- LA Grade A reflux esophagitis. ?- Normal stomach. ?- Normal duodenal bulb and second portion of the duodenum. ? ?Diagnosis ?1. Colon, polyp(s), transverse, sigmoid, (5) ?- TUBULAR ADENOMA(S). ?- HIGH  GRADE DYSPLASIA IS NOT IDENTIFIED. ?2. Colon, polyp(s), cecum ?- ADENOCARCINOMA. ?- SEE COMMENT. ? ?Imaging:  ? ?Nov 2022 CTAP w/ contrast ?IMPRESSION: ?1. Status post cecal resection with new mural thickening of the neo terminal ileum on today's examination. This is a definite change from the prior examination. This may simply represent mural thickening from backwash ileitis, however, there are no overt surrounding inflammatory changes in the adjacent fat. The possibility of neoplastic infiltration of the neo terminal ileum is not entirely excluded. Further clinical evaluation is recommended,and close attention on follow-up imaging is strongly recommended. ?2. No suspicious findings to suggest metastatic disease elsewhere in the chest, abdomen or pelvis. ?3. Chronic changes in the lungs once again favored to reflect post ?infectious/post inflammatory fibrosis as can be seen with ?cryptogenic organizing pneumonia (COP) in the setting of prior COVID infection. ?4. Aortic atherosclerosis, in addition to right coronary artery ?disease. Please note that although the presence of coronary artery calcium documents the presence of coronary artery disease, the ?severity of this disease and any potential stenosis cannot be ?assessed on this non-gated CT examination. Assessment for potential risk factor modification, dietary therapy or pharmacologic therapymay be warranted, if clinically indicated. ? ? ?Past Medical History:  ?Diagnosis Date  ? Cataract 2015  ? right ecl with lens inplant  ? Cataract 2021  ? LEFT eye sx  ? COVID 05/2019  ? GERD (gastroesophageal reflux disease)   ? on meds  ? HTN (hypertension)   ? on meds  ? Pneumonia due to COVID-19 virus 05/2019  ? Primary cancer of cecum ([place]) dx'd 01/25/19  ? ? ?Past Surgical History:  ?Procedure Laterality Date  ? APPENDECTOMY  03/2019  ? COLONOSCOPY  2020  ? cecum CA dx-multiple polyps  ? ENDOSCOPIC VEIN LASER TREATMENT Bilateral 2020  ? legs   ? INGUINAL HERNIA REPAIR  Right   ? age 64  ? LAPAROSCOPIC PARTIAL COLECTOMY Right 03/15/2019  ? Procedure: LAPAROSCOPIC RIGHT HEMICOLECTOMY;  Surgeon: Ileana Roup, MD;  Location: [facility];  Service: General;  Laterality: Right;  ? MENISCUS REPAIR Bilateral   ? POLYPECTOMY  2020  ? mulitple polyps/cecum CA  ? VARICOSE VEIN SURGERY Right   ? ? ?Current Medications, Allergies, Family History and Social History were reviewed in [employer] record. ?  ?  ?Current Outpatient Medications  ?Medication Sig Dispense Refill  ? aspirin EC 81 MG tablet Take 81 mg by mouth daily.     ? influenza vac split quadrivalent PF (FLUARIX) 0.5 ML injection Inject into the muscle. 0.5 mL 0  ? losartan (COZAAR) 50 MG tablet Take 1 tablet (50 mg total) by mouth daily. FUTURE REFILLS PER PRIMARY CARE MD 30 tablet 1  ? omeprazole (PRILOSEC) 20 MG capsule Take 20 mg by mouth daily.    ? sildenafil (REVATIO) 20 MG tablet Take 20 mg by mouth daily as needed (ED).    ? ?No current facility-administered medications for this visit.  ? ? ?Review of Systems: ?No chest pain. No shortness of breath. No urinary complaints.  ? ? ?Physical Exam ? ?Wt Readings from Last 3 Encounters:  ?04/06/21 191 lb 6.4 oz (86.8  kg)  ?03/24/21 187 lb (84.8 kg)  ?10/30/20 186 lb 9.6 oz (84.6 kg)  ? ? ?BP 120/72   Pulse 72   Ht '5\' 10"'$  (1.778 m)   Wt 187 lb 3.2 oz (84.9 kg)   SpO2 97%   BMI 26.86 kg/m?  ?Constitutional:  Generally well appearing male in no acute distress. ?Psychiatric: Pleasant. Normal mood and affect. Behavior is normal. ?EENT: Pupils normal.  Conjunctivae are normal. No scleral icterus. ?Neck supple.  ?Cardiovascular: Normal rate, regular rhythm. No edema ?Pulmonary/chest: Effort normal and breath sounds normal. No wheezing, rales or rhonchi. ?Abdominal: Soft, nondistended, nontender. Bowel sounds active throughout. There are no masses palpable. No hepatomegaly. ?Neurological: Alert and oriented to person place and time. ?Skin: Skin is warm and  dry. No rashes noted. ? ?Tye Savoy, NP  09/09/2021, 8:53 AM ? ? ? ? ? ? ? ? ? ?

## 2021-09-21 ENCOUNTER — Inpatient Hospital Stay: Payer: Medicare Other

## 2021-09-21 ENCOUNTER — Inpatient Hospital Stay: Payer: Medicare Other | Attending: Oncology | Admitting: Oncology

## 2021-09-21 ENCOUNTER — Telehealth: Payer: Self-pay

## 2021-09-21 VITALS — BP 148/84 | HR 75 | Temp 98.1°F | Resp 18 | Ht 70.0 in | Wt 186.0 lb

## 2021-09-21 DIAGNOSIS — M549 Dorsalgia, unspecified: Secondary | ICD-10-CM | POA: Insufficient documentation

## 2021-09-21 DIAGNOSIS — Z800 Family history of malignant neoplasm of digestive organs: Secondary | ICD-10-CM | POA: Diagnosis not present

## 2021-09-21 DIAGNOSIS — I10 Essential (primary) hypertension: Secondary | ICD-10-CM | POA: Diagnosis not present

## 2021-09-21 DIAGNOSIS — C180 Malignant neoplasm of cecum: Secondary | ICD-10-CM | POA: Diagnosis present

## 2021-09-21 LAB — CEA (ACCESS): CEA (CHCC): 1.45 ng/mL (ref 0.00–5.00)

## 2021-09-21 NOTE — Telephone Encounter (Signed)
Patient gave verbal understanding and no further questions or concerns at this time. ?

## 2021-09-21 NOTE — Telephone Encounter (Signed)
-----   Message from Ladell Pier, MD sent at 09/21/2021  2:27 PM EDT ----- ?Please call patient, CEA is normal, follow-up as scheduled ? ?

## 2021-09-21 NOTE — Progress Notes (Signed)
?Isaiah Gray ?OFFICE PROGRESS NOTE ? ? ?Diagnosis: Colon cancer ? ?INTERVAL HISTORY:  ? ?Isaiah Gray returns as scheduled.  He feels well.  He is exercising 3 days/week.  No difficulty with bowel function.  No bleeding.  Neuropathy symptoms have resolved.  He developed discomfort at the right upper back near the medial aspect of the scapula beginning in March.  The pain generally occurred in the afternoon and lasted for about an hour.  This has improved.  He is scheduled for upper and lower endoscopy procedures next month. ? ?Objective: ? ?Vital signs in last 24 hours: ? ?Blood pressure (!) 148/84, pulse 75, temperature 98.1 ?F (36.7 ?C), temperature source Oral, resp. rate 18, height _0  (1.778 m), weight 84.4 kg, SpO2 100 %. ?  ? ?Lymphatics: No cervical, supraclavicular, axillary, or inguinal nodes ?Resp: Lungs clear bilaterally ?Cardio: Regular rate and rhythm ?GI: No mass, nontender, no hepatosplenomegaly ?Vascular: No leg edema, the right lower leg is larger than the left side ?Musculoskeletal: No tenderness at the right back ? ?Lab Results: ? ?Lab Results  ?Component Value Date  ? WBC 7.1 07/05/2019  ? HGB 12.6 (L) 07/05/2019  ? HCT 37.8 (L) 07/05/2019  ? MCV 93.3 07/05/2019  ? PLT 342 07/05/2019  ? NEUTROABS 4.0 07/05/2019  ? ? ?CMP  ?Lab Results  ?Component Value Date  ? NA 138 03/22/2021  ? K 4.1 03/22/2021  ? CL 103 03/22/2021  ? CO2 29 03/22/2021  ? GLUCOSE 104 (H) 03/22/2021  ? BUN 22 03/22/2021  ? CREATININE 1.24 03/22/2021  ? CALCIUM 9.6 03/22/2021  ? PROT 6.3 (L) 07/05/2019  ? ALBUMIN 3.2 (L) 07/05/2019  ? AST 31 07/05/2019  ? ALT 27 07/05/2019  ? ALKPHOS 129 (H) 07/05/2019  ? BILITOT 0.6 07/05/2019  ? GFRNONAA >60 03/22/2021  ? GFRAA >60 07/05/2019  ? ? ?Lab Results  ?Component Value Date  ? CEA1 1.56 10/30/2020  ? CEA 2.02 03/22/2021  ? ? ? ?Medications: I have reviewed the patient's current medications. ? ? ?Assessment/Plan: ?Adenocarcinoma of the cecum, stage IIIb (T3N1B)   ?Right hemicolectomy 03/15/2019 moderate-poorly differentiated, lymphovascular invasion present, 2/17 lymph nodes positive, no loss of mismatch repair protein expression, MSI-low (MSI low pattern also seen in adjacent normal tissue-possible polymorphism in normal DNA, single locus of instability observed) ?Colonoscopy 01/25/2019-cecal mass, multiple polyps including the ascending, transverse, and sigmoid colon, pathology from the cecum-adenocarcinoma, pathology from the transverse and sigmoid polyps-tubular adenomas ?Tubular adenoma at the proximal ascending colon on the right colectomy specimen ?CTs 02/06/2019-mass at the medial wall of the cecum, 2 small ileocolic nodes measuring 5 mm, 4 mm irregular subpleural nodule in the anterior right middle lobe-favored to be a benign subpleural lymph node ?Elevated preoperative CEA, normal 04/05/2019 ?Cycle 1 CAPOX 04/10/2019 ?Cycle 2 CAPOX 05/01/2019 ?Cycle 3 CAPOX 05/22/2019, Xeloda dose reduced secondary to hand/foot syndrome and diarrhea, Xeloda discontinued 05/28/2019 secondary to diarrhea and COVID-19 infection ?Cycle 4 CAPOX 06/21/2019 ?CTs 06/22/2019-new patchy peripheral consolidations bilaterally, 1 mm calcification left lower pelvis-likely a distal ureteral stone ?CTs 03/17/2020-no metastatic disease; basilar predominant fibrotic interstitial lung disease favoring postinflammatory fibrosis ?Colonoscopy 03/27/2020-polyps removed from the sigmoid colon and rectosigmoid colon-hyperplastic polyps ?CTs 03/22/2021-new mural thickening of the neoterminal ileum, no evidence of metastatic disease elsewhere in the chest, abdomen, pelvis, chronic lung changes ?Gastroesophageal reflux ?Hypertension ?4.   Bilateral inguinal hernias on CT 02/06/19 ?5.   Family history of colon cancer ?6.   Multiple tubular adenomas on the colonoscopy 01/25/2019 and  a tubular adenoma on the right colon resection specimen ?7.   Elevated liver enzymes-likely secondary to oxaliplatin ?8.  Diarrhea, cough,  dehydration 05/30/2019-referred to the emergency room and diagnosed with COVID-19 infection, admitted 06/02/2019 completed course of remdesivir and steroids ?9.  Oxaliplatin neuropathy ? ? ?Disposition: ?Isaiah Gray remains in clinical remission from colon cancer.  We will follow-up on the CEA from today.  The right upper back discomfort is likely related to a benign musculoskeletal condition.  He will undergo a colonoscopy next month to follow-up on the thickened neoterminal ileum noted on the CT in November. ? ?He will be scheduled for an office visit and surveillance CTs in 6 months. ? ?Betsy Coder, MD ? ?09/21/2021  ?9:01 AM ? ? ?

## 2021-10-05 ENCOUNTER — Telehealth: Payer: Self-pay | Admitting: Nurse Practitioner

## 2021-10-05 MED ORDER — PLENVU 140 G PO SOLR: 1.0000 | Freq: Once | ORAL | 0 refills | Status: AC

## 2021-10-05 NOTE — Telephone Encounter (Signed)
Plenvu sent to requested pharmacy today   Called pt to inform resent Plenvu

## 2021-10-05 NOTE — Telephone Encounter (Signed)
Inbound call from patient stating that his prep for his procedure on 6/5 was called in to the wrong pharmacy. Patient is requesting that it be resent to [facility] on [address] [place], [place] [address]. Please advise.

## 2021-10-11 ENCOUNTER — Ambulatory Visit (AMBULATORY_SURGERY_CENTER): Payer: Medicare Other | Admitting: Gastroenterology

## 2021-10-11 ENCOUNTER — Encounter: Payer: Self-pay | Admitting: Gastroenterology

## 2021-10-11 VITALS — BP 117/72 | HR 70 | Temp 96.8°F | Resp 16 | Ht 70.0 in | Wt 187.0 lb

## 2021-10-11 DIAGNOSIS — K317 Polyp of stomach and duodenum: Secondary | ICD-10-CM | POA: Diagnosis not present

## 2021-10-11 DIAGNOSIS — K219 Gastro-esophageal reflux disease without esophagitis: Secondary | ICD-10-CM

## 2021-10-11 DIAGNOSIS — K648 Other hemorrhoids: Secondary | ICD-10-CM

## 2021-10-11 DIAGNOSIS — R933 Abnormal findings on diagnostic imaging of other parts of digestive tract: Secondary | ICD-10-CM

## 2021-10-11 DIAGNOSIS — K5730 Diverticulosis of large intestine without perforation or abscess without bleeding: Secondary | ICD-10-CM | POA: Diagnosis not present

## 2021-10-11 DIAGNOSIS — Z85038 Personal history of other malignant neoplasm of large intestine: Secondary | ICD-10-CM

## 2021-10-11 MED ORDER — SODIUM CHLORIDE 0.9 % IV SOLN: 500.0000 mL | Freq: Once | INTRAVENOUS | Status: DC

## 2021-10-11 NOTE — Progress Notes (Signed)
To pacu, VSS. Report to [email or web address] 

## 2021-10-11 NOTE — Progress Notes (Signed)
Pt's states no medical or surgical changes since previsit or office visit. 

## 2021-10-11 NOTE — Progress Notes (Signed)
Called to room to assist during endoscopic procedure.  Patient ID and intended procedure confirmed with present staff. Received instructions for my participation in the procedure from the performing physician.  

## 2021-10-11 NOTE — Op Note (Signed)
[place] Patient Name: Isaiah Gray Procedure Date: 10/11/2021 3:31 PM MRN: [id number] Endoscopist: Remo Lipps P. Havery Moros , MD Age: 66 Referring MD:  Date of Birth: [birth year] Gender: Male Account #: [id number] Procedure:                Colonoscopy Indications:              Abnormal CT of the GI tract - ileal thickening,                            history of colon cancer 2020 s/p resection, last                            colonoscopy 03/2020 but ileum was not intubated,                            colonoscopy to ensure no malignant invasion of the                            ileum Medicines:                Monitored Anesthesia Care Procedure:                Pre-Anesthesia Assessment:                           - Prior to the procedure, a History and Physical                            was performed, and patient medications and                            allergies were reviewed. The patient's tolerance of                            previous anesthesia was also reviewed. The risks                            and benefits of the procedure and the sedation                            options and risks were discussed with the patient.                            All questions were answered, and informed consent                            was obtained. Prior Anticoagulants: The patient has                            taken no previous anticoagulant or antiplatelet                            agents. ASA Grade Assessment: II - A patient with  mild systemic disease. After reviewing the risks                            and benefits, the patient was deemed in                            satisfactory condition to undergo the procedure.                           After obtaining informed consent, the colonoscope                            was passed under direct vision. Throughout the                            procedure, the patient's blood pressure, pulse, and                             oxygen saturations were monitored continuously. The                            Olympus PCF-H190DL (#[id number]) Colonoscope was                            introduced through the anus and advanced to the the                            terminal ileum. The colonoscopy was performed                            without difficulty. The patient tolerated the                            procedure well. The quality of the bowel                            preparation was good. The terminal ileum and the                            rectum, surgical anastomosis were photographed. Scope In: 3:53:43 PM Scope Out: 4:12:15 PM Scope Withdrawal Time: 0 hours 13 minutes 20 seconds  Total Procedure Duration: 0 hours 18 minutes 32 seconds  Findings:                 The perianal and digital rectal examinations were                            normal.                           The terminal ileum appeared normal of what was able                            to be seen / intubated.  There was evidence of a prior end-to-side                            ileo-colonic anastomosis in the ascending colon.                            This was patent and was characterized by healthy                            appearing mucosa. There was an angulated entrance                            into the ileum given this anatomy, pediatric                            colonoscope used to intubate the ileum.                           A few small-mouthed diverticula were found in the                            transverse colon and left colon.                           Internal hemorrhoids were found during retroflexion.                           The colon was tortous with angulated turn in the                            left colon. There was benign appearing extrinsic                            compression of the rectum, stable compared to the                            last exam in 2021, and no  findings on CT scan to                            correlate to this finding. The exam was otherwise                            without abnormality. Complications:            No immediate complications. Estimated blood loss:                            None. Estimated Blood Loss:     Estimated blood loss: none. Impression:               - The examined portion of the ileum was normal.                           - Patent end-to-side ileo-colonic anastomosis,  characterized by healthy appearing mucosa.                           - Diverticulosis in the transverse colon and in the                            left colon.                           - Internal hemorrhoids.                           - Suspected extrinsic compression of the rectum -                            stable compared to previous and no cause seen for                            this on CT scan                           - Tortous colon with angulated turn in the left                            colon                           - The examination was otherwise normal.                           - No polyps                           No evidence of malignant invasion of the ileum. Recommendation:           - Patient has a contact number available for                            emergencies. The signs and symptoms of potential                            delayed complications were discussed with the                            patient. Return to normal activities tomorrow.                            Written discharge instructions were provided to the                            patient.                           - Resume previous diet.                           - Continue present medications.                           -  Repeat colonoscopy in 3 to 5 years for                            surveillance. Await follow up surveillance imaging Carlota Raspberry. Pinki Rottman, MD 10/11/2021 4:24:56 PM This report has been signed  electronically.

## 2021-10-11 NOTE — Progress Notes (Deleted)
To pacu, VSS. Report to [email or web address] 

## 2021-10-11 NOTE — Op Note (Signed)
[place] Patient Name: Isaiah Gray Procedure Date: 10/11/2021 3:31 PM MRN: [id number] Endoscopist: Remo Lipps P. Meisha Salone , MD Age: 66 Referring MD:  Date of Birth: 1955/09/29 Gender: Male Account #: [id number] Procedure:                Upper GI endoscopy Indications:              Suspected gastro-esophageal reflux disease -                            history of mid back pains post prandial? patient                            states he has had these symptoms with reflux in the                            past, now on higher dose omeprazole '40mg'$  / day and                            some dietary changes and feeling better. Medicines:                Monitored Anesthesia Care Procedure:                Pre-Anesthesia Assessment:                           - Prior to the procedure, a History and Physical                            was performed, and patient medications and                            allergies were reviewed. The patient's tolerance of                            previous anesthesia was also reviewed. The risks                            and benefits of the procedure and the sedation                            options and risks were discussed with the patient.                            All questions were answered, and informed consent                            was obtained. Prior Anticoagulants: The patient has                            taken no previous anticoagulant or antiplatelet                            agents. ASA Grade Assessment: II - A patient with  mild systemic disease. After reviewing the risks                            and benefits, the patient was deemed in                            satisfactory condition to undergo the procedure.                           After obtaining informed consent, the endoscope was                            passed under direct vision. Throughout the                            procedure, the  patient's blood pressure, pulse, and                            oxygen saturations were monitored continuously. The                            Endoscope was introduced through the mouth, and                            advanced to the second part of duodenum. The upper                            GI endoscopy was accomplished without difficulty.                            The patient tolerated the procedure well. Scope In: Scope Out: Findings:                 The Z-line was regular. No erosive esophagitis or                            Barrett's                           The exam of the esophagus was otherwise normal.                           Multiple small sessile to medium sized polyps were                            found in the gastric fundus and in the gastric                            body. Suspect benign fundic gland polyps or                            hyperplastic. Biopsies were taken of a few polyps  with a cold forceps for histology.                           A single small angiodysplastic lesion with no                            bleeding was found in the gastric antrum.                           The exam of the stomach was otherwise normal.                           The duodenal bulb and second portion of the                            duodenum were normal. Complications:            No immediate complications. Estimated blood loss:                            Minimal. Estimated Blood Loss:     Estimated blood loss was minimal. Impression:               - Z-line regular.                           - Normal esophagus - no erosive esophagitis or                            Barrett's                           - Multiple gastric polyps. Benign appearing.                            Biopsied.                           - A single non-bleeding angiodysplastic lesion in                            the stomach.                           - Normal stomach  otherwise                           - Normal duodenal bulb and second portion of the                            duodenum.                           Overall no concerning pathology on this exam.                            Sounds like he has responded to PPI and symptoms  could be due to GERD. If symptoms persist / recur                            however, consider RUQ [place] to screen for gallstones                            (prior CT imaging without clear cause). Recommendation:           - Patient has a contact number available for                            emergencies. The signs and symptoms of potential                            delayed complications were discussed with the                            patient. Return to normal activities tomorrow.                            Written discharge instructions were provided to the                            patient.                           - Resume previous diet.                           - Continue present medications.                           - Await pathology results. Remo Lipps P. Niquita Digioia, MD 10/11/2021 4:30:53 PM This report has been signed electronically.

## 2021-10-11 NOTE — Progress Notes (Signed)
[facility] History and Physical   Primary Care Physician:  Derrill Center., MD   Reason for Procedure:   Abnormal CT scan ileum, history of colon cancer, GERD  Plan:    EGD and colonoscopy     HPI: Isaiah Gray is a 66 y.o. male  here for EGD and colonoscopy to evaluate issues as above. CT showed thickening of the ileum in November, cannot rule out recurrent neoplasm, he has no concerning symptoms there. HIstor yof CRC dx [id number], last colonoscopy was 03/2020 without obvious recurrence. Has what he feels is GERD symptoms, pain in his back about 2-3 hours after eating - on higher dose omeprazole and stopped eating chocolate and feeling better since the clinic visit. Otherwise feels well without any cardiopulmonary symptoms. Have discussed risks / benefits and he wishes to proceed.   Past Medical History:  Diagnosis Date   Cataract 2015   right ecl with lens inplant   Cataract 2021   LEFT eye sx   COVID 05/2019   GERD (gastroesophageal reflux disease)    on meds   HTN (hypertension)    on meds   Pneumonia due to COVID-19 virus 05/2019   Primary cancer of cecum ([place]) dx'd 01/25/19    Past Surgical History:  Procedure Laterality Date   APPENDECTOMY  03/2019   COLONOSCOPY  2020   cecum CA dx-multiple polyps   ENDOSCOPIC VEIN LASER TREATMENT Bilateral 2020   legs    INGUINAL HERNIA REPAIR Right    age 68   LAPAROSCOPIC PARTIAL COLECTOMY Right 03/15/2019   Procedure: LAPAROSCOPIC RIGHT HEMICOLECTOMY;  Surgeon: Ileana Roup, MD;  Location: [facility];  Service: General;  Laterality: Right;   MENISCUS REPAIR Bilateral    POLYPECTOMY  2020   mulitple polyps/cecum CA   VARICOSE VEIN SURGERY Right     Prior to Admission medications   Medication Sig Start Date End Date Taking? Authorizing Provider  aspirin EC 81 MG tablet Take 81 mg by mouth daily.    Yes [provider]  atorvastatin (LIPITOR) 20 MG tablet Take 20 mg by mouth daily.   Yes [provider]  losartan (COZAAR) 50 MG tablet Take 1 tablet (50 mg total) by mouth daily. FUTURE REFILLS PER PRIMARY CARE MD Patient taking differently: Take 100 mg by mouth daily. FUTURE REFILLS PER PRIMARY CARE MD 07/15/19  Yes Ladell Pier, MD  omeprazole (PRILOSEC) 20 MG capsule Take 40 mg by mouth daily. 11/15/19  Yes [provider]  sildenafil (REVATIO) 20 MG tablet Take 20 mg by mouth daily as needed (ED).   Yes [provider]    Current Outpatient Medications  Medication Sig Dispense Refill   aspirin EC 81 MG tablet Take 81 mg by mouth daily.      atorvastatin (LIPITOR) 20 MG tablet Take 20 mg by mouth daily.     losartan (COZAAR) 50 MG tablet Take 1 tablet (50 mg total) by mouth daily. FUTURE REFILLS PER PRIMARY CARE MD (Patient taking differently: Take 100 mg by mouth daily. FUTURE REFILLS PER PRIMARY CARE MD) 30 tablet 1   omeprazole (PRILOSEC) 20 MG capsule Take 40 mg by mouth daily.     sildenafil (REVATIO) 20 MG tablet Take 20 mg by mouth daily as needed (ED).     Current Facility-Administered Medications  Medication Dose Route Frequency Provider Last Rate Last Admin   0.9 %  sodium chloride infusion  500 mL Intravenous Once Charvi Gammage, Carlota Raspberry, MD  Allergies as of 10/11/2021   (No Known Allergies)    Family History  Problem Relation Age of Onset   Hypertension Mother    Colon polyps Mother    Hypertension Father    Colon cancer Maternal Grandmother 60   Colon polyps Maternal Grandmother    Cancer Paternal Grandmother        ? intestinal   Esophageal cancer Neg Hx    Stomach cancer Neg Hx    Rectal cancer Neg Hx     Social History   Socioeconomic History   Marital status: Married    Spouse name: Not on file   Number of children: 2   Years of education: Not on file   Highest education level: Not on file  Occupational History   Occupation: Cabin crew  Tobacco Use   Smoking status: Never   Smokeless tobacco: Never  Vaping  Use   Vaping Use: Never used  Substance and Sexual Activity   Alcohol use: Yes    Comment: weekends   Drug use: Yes    Types: Marijuana    Comment: 03-12-2019 , last use x 1 month ago    Sexual activity: Not on file  Other Topics Concern   Not on file  Social History Narrative   Not on file   Social Determinants of Health   Financial Resource Strain: Not on file  Food Insecurity: Not on file  Transportation Needs: Not on file  Physical Activity: Not on file  Stress: Not on file  Social Connections: Not on file  Intimate Partner Violence: Not on file    Review of Systems: All other review of systems negative except as mentioned in the HPI.  Physical Exam: Vital signs BP (!) 136/107   Pulse 83   Temp (!) 96.8 F (36 C)   Resp (!) 22   Ht '5\' 10"'$  (1.778 m)   Wt 187 lb (84.8 kg)   SpO2 97%   BMI 26.83 kg/m   General:   Alert,  Well-developed, pleasant and cooperative in NAD Lungs:  Clear throughout to auscultation.   Heart:  Regular rate and rhythm Abdomen:  Soft, nontender and nondistended.   Neuro/Psych:  Alert and cooperative. Normal mood and affect. A and O x 3  Jolly Mango, MD [facility]

## 2021-10-11 NOTE — Patient Instructions (Signed)
Please read handouts provided. Continue present medications. Await pathology results.   YOU HAD AN ENDOSCOPIC PROCEDURE TODAY AT [facility]:   Refer to the procedure report that was given to you for any specific questions about what was found during the examination.  If the procedure report does not answer your questions, please call your gastroenterologist to clarify.  If you requested that your care partner not be given the details of your procedure findings, then the procedure report has been included in a sealed envelope for you to review at your convenience later.  YOU SHOULD EXPECT: Some feelings of bloating in the abdomen. Passage of more gas than usual.  Walking can help get rid of the air that was put into your GI tract during the procedure and reduce the bloating. If you had a lower endoscopy (such as a colonoscopy or flexible sigmoidoscopy) you may notice spotting of blood in your stool or on the toilet paper. If you underwent a bowel prep for your procedure, you may not have a normal bowel movement for a few days.  Please Note:  You might notice some irritation and congestion in your nose or some drainage.  This is from the oxygen used during your procedure.  There is no need for concern and it should clear up in a day or so.  SYMPTOMS TO REPORT IMMEDIATELY:  Following lower endoscopy (colonoscopy or flexible sigmoidoscopy):  Excessive amounts of blood in the stool  Significant tenderness or worsening of abdominal pains  Swelling of the abdomen that is new, acute  Fever of 100F or higher  Following upper endoscopy (EGD)  Vomiting of blood or coffee ground material  New chest pain or pain under the shoulder blades  Painful or persistently difficult swallowing  New shortness of breath  Fever of 100F or higher  Black, tarry-looking stools  For urgent or emergent issues, a gastroenterologist can be reached at any hour by calling [phone]. Do not use  MyChart messaging for urgent concerns.    DIET:  We do recommend a small meal at first, but then you may proceed to your regular diet.  Drink plenty of fluids but you should avoid alcoholic beverages for 24 hours.  ACTIVITY:  You should plan to take it easy for the rest of today and you should NOT DRIVE or use heavy machinery until tomorrow (because of the sedation medicines used during the test).    FOLLOW UP: Our staff will call the number listed on your records 24-72 hours following your procedure to check on you and address any questions or concerns that you may have regarding the information given to you following your procedure. If we do not reach you, we will leave a message.  We will attempt to reach you two times.  During this call, we will ask if you have developed any symptoms of COVID 19. If you develop any symptoms (ie: fever, flu-like symptoms, shortness of breath, cough etc.) before then, please call [phone].  If you test positive for Covid 19 in the 2 weeks post procedure, please call and report this information to [place].    If any biopsies were taken you will be contacted by phone or by letter within the next 1-3 weeks.  Please call us at [phone] if you have not heard about the biopsies in 3 weeks.    SIGNATURES/CONFIDENTIALITY: You and/or your care partner have signed paperwork which will be entered into your electronic medical record.  These signatures  attest to the fact that that the information above on your After Visit Summary has been reviewed and is understood.  Full responsibility of the confidentiality of this discharge information lies with you and/or your care-partner.

## 2021-10-12 ENCOUNTER — Telehealth: Payer: Self-pay

## 2021-10-12 NOTE — Telephone Encounter (Signed)
  Follow up Call-     10/11/2021    2:52 PM 03/27/2020   12:59 PM 01/25/2019    3:18 PM  Call back number  Post procedure Call Back phone  # [phone]  Permission to leave phone message Yes Yes Yes     Patient questions:  Do you have a fever, pain , or abdominal swelling? No. Pain Score  0 *  Have you tolerated food without any problems? Yes.    Have you been able to return to your normal activities? Yes.    Do you have any questions about your discharge instructions: Diet   No. Medications  No. Follow up visit  No.  Do you have questions or concerns about your Care? No.  Actions: * If pain score is 4 or above: No action needed, pain <4.

## 2021-10-12 NOTE — Telephone Encounter (Signed)
Attempted f/u call. No answer, left VM. 

## 2021-11-29 ENCOUNTER — Other Ambulatory Visit: Payer: Self-pay

## 2021-12-12 ENCOUNTER — Encounter: Payer: Self-pay | Admitting: Emergency Medicine

## 2021-12-12 ENCOUNTER — Ambulatory Visit
Admission: EM | Admit: 2021-12-12 | Discharge: 2021-12-12 | Disposition: A | Discharge status: Home / Self Care | Payer: Medicare Other | Attending: Urgent Care | Admitting: Urgent Care

## 2021-12-12 DIAGNOSIS — L249 Irritant contact dermatitis, unspecified cause: Secondary | ICD-10-CM

## 2021-12-12 DIAGNOSIS — L299 Pruritus, unspecified: Secondary | ICD-10-CM

## 2021-12-12 MED ORDER — PREDNISONE 20 MG PO TABS
ORAL_TABLET | ORAL | 0 refills | Status: DC
Start: 2021-12-12 — End: 2021-12-12

## 2021-12-12 MED ORDER — HYDROXYZINE HCL 25 MG PO TABS: 12.5000 mg | ORAL_TABLET | Freq: Three times a day (TID) | ORAL | 0 refills | Status: DC | PRN

## 2021-12-12 MED ORDER — PREDNISONE 20 MG PO TABS: ORAL_TABLET | ORAL | 0 refills | Status: DC

## 2021-12-12 NOTE — ED Triage Notes (Signed)
Pt here with rash on body after cutting hedges a week ago that is intermittently itchy.

## 2021-12-12 NOTE — ED Provider Notes (Signed)
[place]   MRN: [id number] DOB: 1955-12-09  Subjective:   Isaiah Gray is a 66 y.o. male presenting for 1 week history of persistent itchy spots over both arms and lower legs extending to the left thigh.  He also has a spot on his face close to his eye.  Areas are intermittently itchy.  Symptoms started after he was pulling vines and trimming some hedges a week ago.  No current facility-administered medications for this encounter.  Current Outpatient Medications:    aspirin EC 81 MG tablet, Take 81 mg by mouth daily. , Disp: , Rfl:    atorvastatin (LIPITOR) 20 MG tablet, Take 20 mg by mouth daily., Disp: , Rfl:    losartan (COZAAR) 50 MG tablet, Take 1 tablet (50 mg total) by mouth daily. FUTURE REFILLS PER PRIMARY CARE MD (Patient taking differently: Take 100 mg by mouth daily. FUTURE REFILLS PER PRIMARY CARE MD), Disp: 30 tablet, Rfl: 1   omeprazole (PRILOSEC) 20 MG capsule, Take 40 mg by mouth daily., Disp: , Rfl:    sildenafil (REVATIO) 20 MG tablet, Take 20 mg by mouth daily as needed (ED)., Disp: , Rfl:    No Known Allergies  Past Medical History:  Diagnosis Date   Cataract 2015   right ecl with lens inplant   Cataract 2021   LEFT eye sx   COVID 05/2019   GERD (gastroesophageal reflux disease)    on meds   HTN (hypertension)    on meds   Pneumonia due to COVID-19 virus 05/2019   Primary cancer of cecum ([place]) dx'd 01/25/19     Past Surgical History:  Procedure Laterality Date   APPENDECTOMY  03/2019   COLONOSCOPY  2020   cecum CA dx-multiple polyps   ENDOSCOPIC VEIN LASER TREATMENT Bilateral 2020   legs    INGUINAL HERNIA REPAIR Right    age 64   LAPAROSCOPIC PARTIAL COLECTOMY Right 03/15/2019   Procedure: LAPAROSCOPIC RIGHT HEMICOLECTOMY;  Surgeon: Ileana Roup, MD;  Location: [facility];  Service: General;  Laterality: Right;   MENISCUS REPAIR Bilateral    POLYPECTOMY  2020   mulitple polyps/cecum CA   VARICOSE VEIN SURGERY Right      Family History  Problem Relation Age of Onset   Hypertension Mother    Colon polyps Mother    Hypertension Father    Colon cancer Maternal Grandmother 17   Colon polyps Maternal Grandmother    Cancer Paternal Grandmother        ? intestinal   Esophageal cancer Neg Hx    Stomach cancer Neg Hx    Rectal cancer Neg Hx     Social History   Tobacco Use   Smoking status: Never   Smokeless tobacco: Never  Vaping Use   Vaping Use: Never used  Substance Use Topics   Alcohol use: Yes    Comment: weekends   Drug use: Yes    Types: Marijuana    Comment: 03-12-2019 , last use x 1 month ago     ROS   Objective:   Vitals: BP 133/84 (BP Location: Left Arm)   Pulse 74   Temp 98 F (36.7 C) (Oral)   Resp 18   SpO2 97%   Physical Exam Constitutional:      General: He is not in acute distress.    Appearance: Normal appearance. He is well-developed and normal weight. He is not ill-appearing, toxic-appearing or diaphoretic.  HENT:     Head:  Normocephalic and atraumatic.     Right Ear: External ear normal.     Left Ear: External ear normal.     Nose: Nose normal.     Mouth/Throat:     Pharynx: Oropharynx is clear.  Eyes:     General: No scleral icterus.       Right eye: No discharge.        Left eye: No discharge.     Extraocular Movements: Extraocular movements intact.  Cardiovascular:     Rate and Rhythm: Normal rate.  Pulmonary:     Effort: Pulmonary effort is normal.  Musculoskeletal:     Cervical back: Normal range of motion.  Skin:    Findings: Rash (multiple erythematous lesions, excoriations some in linear distribution over all extermities and to a lesser extent just under his left eye) present.  Neurological:     Mental Status: He is alert and oriented to person, place, and time.  Psychiatric:        Mood and Affect: Mood normal.        Behavior: Behavior normal.        Thought Content: Thought content normal.        Judgment: Judgment normal.      Assessment and Plan :   PDMP not reviewed this encounter.  1. Irritant contact dermatitis, unspecified trigger   2. Itching    Recommended an oral prednisone course for an irritant dermatitis secondary to the vines and plants that he was working with.  This is appropriate given the extent of the rash and facial involvement.  Hydroxyzine for itching. Counseled patient on potential for adverse effects with medications prescribed/recommended today, ER and return-to-clinic precautions discussed, patient verbalized understanding.    Jaynee Eagles, PA-C 12/12/21 [id number]

## 2022-03-21 ENCOUNTER — Inpatient Hospital Stay: Payer: Medicare Other | Attending: Oncology

## 2022-03-21 ENCOUNTER — Ambulatory Visit (HOSPITAL_BASED_OUTPATIENT_CLINIC_OR_DEPARTMENT_OTHER)
Admission: RE | Admit: 2022-03-21 | Discharge: 2022-03-21 | Disposition: A | Discharge status: Home / Self Care | Payer: Medicare Other | Source: Ambulatory Visit | Attending: Oncology | Admitting: Oncology

## 2022-03-21 DIAGNOSIS — C180 Malignant neoplasm of cecum: Secondary | ICD-10-CM | POA: Diagnosis present

## 2022-03-21 DIAGNOSIS — Z23 Encounter for immunization: Secondary | ICD-10-CM | POA: Insufficient documentation

## 2022-03-21 LAB — BASIC METABOLIC PANEL - CANCER CENTER ONLY
Anion gap: 8 (ref 5–15)
BUN: 21 mg/dL (ref 8–23)
CO2: 28 mmol/L (ref 22–32)
Calcium: 9.6 mg/dL (ref 8.9–10.3)
Chloride: 105 mmol/L (ref 98–111)
Creatinine: 1.16 mg/dL (ref 0.61–1.24)
GFR, Estimated: >60 mL/min (ref >60)
Glucose, Bld: 111 mg/dL — ABNORMAL HIGH (ref 70–99)
Potassium: 3.8 mmol/L (ref 3.5–5.1)
Sodium: 141 mmol/L (ref 135–145)

## 2022-03-21 LAB — CEA (ACCESS): CEA (CHCC): 1.59 ng/mL (ref 0.00–5.00)

## 2022-03-21 MED ORDER — IOHEXOL 300 MG/ML  SOLN
100.0000 mL | Freq: Once | INTRAMUSCULAR | Status: AC | PRN
  Administered 2022-03-21: 85 mL via INTRAVENOUS

## 2022-03-23 ENCOUNTER — Inpatient Hospital Stay: Payer: Medicare Other

## 2022-03-23 ENCOUNTER — Inpatient Hospital Stay (HOSPITAL_BASED_OUTPATIENT_CLINIC_OR_DEPARTMENT_OTHER): Payer: Medicare Other | Admitting: Oncology

## 2022-03-23 VITALS — BP 148/86 | HR 95 | Temp 98.1°F | Resp 18 | Ht 70.0 in | Wt 182.4 lb

## 2022-03-23 DIAGNOSIS — Z23 Encounter for immunization: Secondary | ICD-10-CM

## 2022-03-23 DIAGNOSIS — C180 Malignant neoplasm of cecum: Secondary | ICD-10-CM

## 2022-03-23 MED ORDER — INFLUENZA VAC A&B SA ADJ QUAD 0.5 ML IM PRSY
0.5000 mL | PREFILLED_SYRINGE | Freq: Once | INTRAMUSCULAR | Status: AC
  Administered 2022-03-23: 0.5 mL via INTRAMUSCULAR
  Filled 2022-03-23: qty 0.5

## 2022-03-23 NOTE — Progress Notes (Signed)
Isaiah Gray OFFICE PROGRESS NOTE   Diagnosis: Colon cancer  INTERVAL HISTORY:   Isaiah Gray returns as scheduled.  He feels well.  Good appetite and energy level.  He is working and exercising. Upper endoscopy 10/11/2021.  No bleeding. there were multiple gastric polyps and a single nonbleeding angiodysplastic lesion in the stomach.  A colonoscopy the same day.  The examined portion of the ileum was normal.  No polyps.  The pathology from the gastric biopsy revealed a fundic gland polyp.  He relates weight loss to exercise.  Objective:  Vital signs in last 24 hours:  Blood pressure (!) 148/86, pulse 95, temperature 98.1 F (36.7 C), temperature source Oral, resp. rate 18, height _0  (1.778 m), weight 182 lb 6.4 oz (82.7 kg), SpO2 100 %.    HEENT: Neck without mass Lymphatics: No cervical, supraclavicular, axillary, or inguinal nodes.  3-4 cm soft mobile subcutaneous mass in the high medial right axilla Resp: Lungs clear bilaterally Cardio: Regular rate and rhythm GI: No mass, nontender, no hepatosplenomegaly Vascular: No leg edema  Skin: 1.5 cm mobile cutaneous lesion at the left forearm  Lab Results:  Lab Results  Component Value Date   WBC 7.1 07/05/2019   HGB 12.6 (L) 07/05/2019   HCT 37.8 (L) 07/05/2019   MCV 93.3 07/05/2019   PLT 342 07/05/2019   NEUTROABS 4.0 07/05/2019    CMP  Lab Results  Component Value Date   NA 141 03/21/2022   K 3.8 03/21/2022   CL 105 03/21/2022   CO2 28 03/21/2022   GLUCOSE 111 (H) 03/21/2022   BUN 21 03/21/2022   CREATININE 1.16 03/21/2022   CALCIUM 9.6 03/21/2022   PROT 6.3 (L) 07/05/2019   ALBUMIN 3.2 (L) 07/05/2019   AST 31 07/05/2019   ALT 27 07/05/2019   ALKPHOS 129 (H) 07/05/2019   BILITOT 0.6 07/05/2019   GFRNONAA >60 03/21/2022   GFRAA >60 07/05/2019    Lab Results  Component Value Date   CEA1 1.56 10/30/2020   CEA 1.59 03/21/2022    Lab Results  Component Value Date   INR 1.0 03/12/2019    LABPROT 13.4 03/12/2019    Imaging:  CT CHEST ABDOMEN PELVIS W CONTRAST  Result Date: 03/21/2022 CLINICAL DATA:  Colon cancer surveillance, clinical remission x3 years. * Tracking Code: BO * EXAM: CT CHEST, ABDOMEN, AND PELVIS WITH CONTRAST TECHNIQUE: Multidetector CT imaging of the chest, abdomen and pelvis was performed following the standard protocol during bolus administration of intravenous contrast. RADIATION DOSE REDUCTION: This exam was performed according to the departmental dose-optimization program which includes automated exposure control, adjustment of the mA and/or kV according to patient size and/or use of iterative reconstruction technique. CONTRAST:  33m OMNIPAQUE IOHEXOL 300 MG/ML  SOLN COMPARISON:  Multiple priors including most recent CT chest abdomen pelvis March 22, 2021 FINDINGS: CT CHEST FINDINGS Cardiovascular: Scattered aortic atherosclerosis. No central pulmonary embolus on this nondedicated study. Normal size heart. No significant pericardial effusion/thickening. Coronary artery calcifications. Mediastinum/Nodes: No supraclavicular adenopathy. No suspicious thyroid nodule. No pathologically enlarged mediastinal, hilar or axillary lymph nodes. Lungs/Pleura: No suspicious pulmonary nodules or masses identified. Imilar widespread areas of septal thickening, thickening of the peribronchovascular interstitium and regional architectural distortion bilaterally most evident in the mid to lower lungs. Again with many areas of relative sparing of the immediate subpleural interstitial and no honeycombing. No evidence of disease progression. No pleural effusion.  No pneumothorax.  Left Bochdalek type hernia. Musculoskeletal: No aggressive lytic or blastic  lesion of bone. CT ABDOMEN PELVIS FINDINGS Hepatobiliary: No suspicious hepatic lesion. Gallbladder is decompressed. No biliary ductal dilation. Pancreas: No pancreatic ductal dilation or evidence of acute inflammation. Spleen: No  splenomegaly or focal splenic lesion. Adrenals/Urinary Tract: Bilateral adrenal glands are within normal limits. No hydronephrosis. Kidneys demonstrate symmetric enhancement and excretion of contrast material. Fluid-filled outpouching from the apex of the urinary bladder demonstrates urothelial hyperenhancement, the outpouching is present dating back to June 22, 2019 with increased urothelial hyperenhancement in the interval. Mild symmetric wall thickening of an incompletely distended urinary bladder which is effaced by an enlarged prostate gland. Stomach/Bowel: Radiopaque enteric contrast material traverses the rectum. Stomach is nondistended limiting evaluation. No pathologic dilation of small or large bowel. Prior cecal resection with ileocolonic anastomosis. The distal terminal/neo-terminal ileum is predominantly decompressed. Possible slight interval increase in the soft tissue thickening along the neo-terminal ileum into the ascending colon for instance on image 56/5. Similar soft tissue stranding in the surgical bed. Vascular/Lymphatic: Aortic atherosclerosis. Normal caliber abdominal aorta. No pathologically enlarged abdominal or pelvic lymph nodes. Reproductive: Dystrophic calcifications in the enlarged prostate gland. Other: No significant abdominopelvic free fluid. No discrete peritoneal or omental nodularity. Fat containing inguinal hernias. Fat containing supraumbilical ventral hernia. Musculoskeletal: No aggressive lytic or blastic lesion of bone. Multilevel degenerative changes spine. IMPRESSION: 1. Prior cecal resection with ileocolonic anastomosis. Suspicion for slight interval increase in the soft tissue wall thickening along the neo-terminal ileum extending into the ascending colon, which is nonspecific and may be related to underdistention and postoperative change. However, local recurrence is not excluded. Suggest further evaluation with endoscopy if not recently performed since prior  examination. 2. No evidence of metastatic disease within the chest, abdomen or pelvis. 3. Fluid-filled outpouching from the apex of the urinary bladder demonstrates urothelial hyperenhancement, the outpouching is present dating back to June 22, 2019 with increased urothelial hyperenhancement in the interval, possibly reflecting a urinary bladder diverticulum or urachal remnant. Given the urothelial enhancement would consider further evaluation with cystoscopy. 4. Mild symmetric wall thickening of an incompletely distended urinary bladder which is effaced by an enlarged prostate gland. Findings may reflect sequela of chronic bladder outlet obstruction. 5. Chronic changes in the lungs are similar to prior and again favored to reflect a postinfectious or inflammatory fibrosis. 6.  Aortic Atherosclerosis (ICD10-I70.0). Electronically Signed   By: Dahlia Bailiff M.D.   On: 03/21/2022 15:55    Medications: I have reviewed the patient's current medications.   Assessment/Plan: Adenocarcinoma of the cecum, stage IIIb (T3N1B)  Right hemicolectomy 03/15/2019 moderate-poorly differentiated, lymphovascular invasion present, 2/17 lymph nodes positive, no loss of mismatch repair protein expression, MSI-low (MSI low pattern also seen in adjacent normal tissue-possible polymorphism in normal DNA, single locus of instability observed) Colonoscopy 01/25/2019-cecal mass, multiple polyps including the ascending, transverse, and sigmoid colon, pathology from the cecum-adenocarcinoma, pathology from the transverse and sigmoid polyps-tubular adenomas Tubular adenoma at the proximal ascending colon on the right colectomy specimen CTs 02/06/2019-mass at the medial wall of the cecum, 2 small ileocolic nodes measuring 5 mm, 4 mm irregular subpleural nodule in the anterior right middle lobe-favored to be a benign subpleural lymph node Elevated preoperative CEA, normal 04/05/2019 Cycle 1 CAPOX 04/10/2019 Cycle 2 CAPOX  05/01/2019 Cycle 3 CAPOX 05/22/2019, Xeloda dose reduced secondary to hand/foot syndrome and diarrhea, Xeloda discontinued 05/28/2019 secondary to diarrhea and COVID-19 infection Cycle 4 CAPOX 06/21/2019 CTs 06/22/2019-new patchy peripheral consolidations bilaterally, 1 mm calcification left lower pelvis-likely a distal ureteral stone CTs 03/17/2020-no  metastatic disease; basilar predominant fibrotic interstitial lung disease favoring postinflammatory fibrosis Colonoscopy 03/27/2020-polyps removed from the sigmoid colon and rectosigmoid colon-hyperplastic polyps CTs 03/22/2021-new mural thickening of the neoterminal ileum, no evidence of metastatic disease elsewhere in the chest, abdomen, pelvis, chronic lung changes Colonoscopy 10/11/2021-no polyps, normal examined ileum CTs 03/21/2022-slight interval increase in soft tissue wall thickening at the neoterminal ileum extending into the ascending colon, no evidence of metastatic disease, fluid-filled outpouching at the apex of the urinary bladder present dating back to February 2021, enlarged prostate with mild symmetric bladder wall thickening, chronic changes in the lungs-likely postinfectious/inflammatory Gastroesophageal reflux Upper endoscopy 10/11/2021-fundic gland polyps Hypertension 4.   Bilateral inguinal hernias on CT 02/06/19 5.   Family history of colon cancer 6.   Multiple tubular adenomas on the colonoscopy 01/25/2019 and a tubular adenoma on the right colon resection specimen 7.   Elevated liver enzymes-likely secondary to oxaliplatin 8.  Diarrhea, cough, dehydration 05/30/2019-referred to the emergency room and diagnosed with COVID-19 infection, admitted 06/02/2019 completed course of remdesivir and steroids 9.  Oxaliplatin neuropathy 10.  Soft mobile subcutaneous/axillary mass on exam 03/23/2022     Disposition: Isaiah Gray has a history of days 3 colon cancer.  He is in clinical remission.  I suspect the bowel wall thickening at the  neoterminal ileum is a benign finding.  He underwent a negative colonoscopy in June.  I will present his case at the GI tumor conference to review the findings at the bladder and neoterminal ileum.  The soft tissue fullness in the right axilla is likely a lipoma.  We will review this area at the GI tumor conference.  He will return for an office visit in 3 months.  Betsy Coder, MD  03/23/2022  8:42 AM

## 2022-03-30 ENCOUNTER — Other Ambulatory Visit: Payer: Self-pay

## 2022-03-30 NOTE — Progress Notes (Signed)
error 

## 2022-03-30 NOTE — Progress Notes (Signed)
The proposed treatment discussed in conference is for discussion purpose only and is not a binding recommendation.  The patients have not been physically examined, or presented with their treatment options.  Therefore, final treatment plans cannot be decided.  

## 2022-05-30 ENCOUNTER — Ambulatory Visit (INDEPENDENT_AMBULATORY_CARE_PROVIDER_SITE_OTHER): Payer: Medicare Other

## 2022-05-30 ENCOUNTER — Ambulatory Visit (INDEPENDENT_AMBULATORY_CARE_PROVIDER_SITE_OTHER): Payer: Medicare Other | Admitting: Orthopaedic Surgery

## 2022-05-30 DIAGNOSIS — M25562 Pain in left knee: Secondary | ICD-10-CM

## 2022-05-30 DIAGNOSIS — G8929 Other chronic pain: Secondary | ICD-10-CM

## 2022-05-30 NOTE — Progress Notes (Signed)
+9  Office Visit Note   Patient: Isaiah Gray           Date of Birth: 01/08/1956           MRN: [id number] Visit Date: 05/30/2022              Requested by: Derrill Center., MD [id number] [place],  [place] [address] PCP: Derrill Center., MD   Assessment & Plan: Visit Diagnoses:  1. Chronic pain of left knee     Plan: I did recommend a steroid injection for his left knee which she agreed to and tolerated well.  Will see him back in 4 weeks to see how he is doing overall.  The next steps will be considering even hyaluronic acid for that knee.  I am fine with him working out with his trainer starting tomorrow.  Follow-Up Instructions: Return in about 4 weeks (around 06/27/2022).   Orders:  Orders Placed This Encounter  Procedures   XR Knee 1-2 Views Left   No orders of the defined types were placed in this encounter.     Procedures: No procedures performed   Clinical Data: No additional findings.   Subjective: Chief Complaint  Patient presents with   Left Knee - Pain  The patient is a 67 year old gentleman has been having knee pain off and on for several years now with his left knee.  He did have an arthroscopic intervention about 20 years ago that left knee.  He started have a lot of pain in the lateral aspect of the knee but no known injury.  He is very active.  He said the knee does pop a lot.  He does work out with a Physiological scientist as well.  He is a thin individual and not a diabetic.  HPI  Review of Systems   Objective: Vital Signs: There were no vitals taken for this visit.  Physical Exam He is alert and orient x 3 and in no acute distress Ortho Exam On examination his left knee has slight varus malalignment that is correctable.  There is only slight effusion but excellent range of motion with no ligamentous instability.  His Lachman's and McMurray's exams are negative. Specialty Comments:  No specialty comments available.  Imaging: XR Knee 1-2  Views Left  Result Date: 05/30/2022 2 views of the left knee show mild to moderate arthritic changes with slight medial and slight patellofemoral narrowing.  There are small osteophytes in all 3 compartments.    PMFS History: Patient Active Problem List   Diagnosis Date Noted   Pneumonia due to COVID-19 virus 06/02/2019   GERD (gastroesophageal reflux disease)    Primary cancer of cecum s/p lap right proximal colectomy 03/15/2019    S/P right hemicolectomy 03/15/2019   Benign essential hypertension 06/22/2015   Chronic dryness of both eyes 06/22/2015   Cortical age-related cataract of left eye 06/22/2015   ED (erectile dysfunction) 06/22/2015   Impaired fasting glucose 06/22/2015   Pigmentary glaucoma of both eyes, indeterminate stage 06/22/2015   Pseudophakia, right eye 06/22/2015   Cataract 2015   Past Medical History:  Diagnosis Date   Cataract 2015   right ecl with lens inplant   Cataract 2021   LEFT eye sx   COVID 05/2019   GERD (gastroesophageal reflux disease)    on meds   HTN (hypertension)    on meds   Pneumonia due to COVID-19 virus 05/2019   Primary cancer of cecum ([place]) dx'd 01/25/19  Family History  Problem Relation Age of Onset   Hypertension Mother    Colon polyps Mother    Hypertension Father    Colon cancer Maternal Grandmother 79   Colon polyps Maternal Grandmother    Cancer Paternal Grandmother        ? intestinal   Esophageal cancer Neg Hx    Stomach cancer Neg Hx    Rectal cancer Neg Hx     Past Surgical History:  Procedure Laterality Date   APPENDECTOMY  03/2019   COLONOSCOPY  2020   cecum CA dx-multiple polyps   ENDOSCOPIC VEIN LASER TREATMENT Bilateral 2020   legs    INGUINAL HERNIA REPAIR Right    age 39   LAPAROSCOPIC PARTIAL COLECTOMY Right 03/15/2019   Procedure: LAPAROSCOPIC RIGHT HEMICOLECTOMY;  Surgeon: Ileana Roup, MD;  Location: [facility];  Service: General;  Laterality: Right;   MENISCUS REPAIR Bilateral    POLYPECTOMY   2020   mulitple polyps/cecum CA   VARICOSE VEIN SURGERY Right    Social History   Occupational History   Occupation: Cabin crew  Tobacco Use   Smoking status: Never   Smokeless tobacco: Never  Vaping Use   Vaping Use: Never used  Substance and Sexual Activity   Alcohol use: Yes    Comment: weekends   Drug use: Yes    Types: Marijuana    Comment: 03-12-2019 , last use x 1 month ago    Sexual activity: Not on file

## 2022-06-23 ENCOUNTER — Inpatient Hospital Stay: Payer: Medicare Other | Admitting: Oncology

## 2022-06-29 ENCOUNTER — Ambulatory Visit: Payer: Medicare Other | Admitting: Orthopaedic Surgery

## 2023-01-26 ENCOUNTER — Telehealth: Payer: Self-pay | Admitting: *Deleted

## 2023-01-26 DIAGNOSIS — C180 Malignant neoplasm of cecum: Secondary | ICD-10-CM

## 2023-01-26 NOTE — Telephone Encounter (Signed)
Called to request to reschedule his missed appointment in February. Scheduling message sent.

## 2023-03-06 ENCOUNTER — Inpatient Hospital Stay: Payer: Medicare Other | Attending: Oncology

## 2023-03-06 ENCOUNTER — Inpatient Hospital Stay (HOSPITAL_BASED_OUTPATIENT_CLINIC_OR_DEPARTMENT_OTHER): Payer: Medicare Other | Admitting: Oncology

## 2023-03-06 ENCOUNTER — Inpatient Hospital Stay: Payer: Medicare Other

## 2023-03-06 ENCOUNTER — Other Ambulatory Visit (HOSPITAL_BASED_OUTPATIENT_CLINIC_OR_DEPARTMENT_OTHER): Payer: Self-pay

## 2023-03-06 ENCOUNTER — Other Ambulatory Visit: Payer: Self-pay

## 2023-03-06 VITALS — BP 160/89 | HR 64 | Temp 98.2°F | Resp 18 | Ht 70.0 in | Wt 188.6 lb

## 2023-03-06 DIAGNOSIS — Z23 Encounter for immunization: Secondary | ICD-10-CM

## 2023-03-06 DIAGNOSIS — Z08 Encounter for follow-up examination after completed treatment for malignant neoplasm: Secondary | ICD-10-CM | POA: Diagnosis present

## 2023-03-06 DIAGNOSIS — Z85038 Personal history of other malignant neoplasm of large intestine: Secondary | ICD-10-CM | POA: Diagnosis not present

## 2023-03-06 DIAGNOSIS — C180 Malignant neoplasm of cecum: Secondary | ICD-10-CM

## 2023-03-06 LAB — CEA (ACCESS): CEA (CHCC): 1.62 ng/mL (ref 0.00–5.00)

## 2023-03-06 MED ORDER — INFLUENZA VAC A&B SURF ANT ADJ 0.5 ML IM SUSY
0.5000 mL | PREFILLED_SYRINGE | Freq: Once | INTRAMUSCULAR | Status: AC
  Administered 2023-03-06: 0.5 mL via INTRAMUSCULAR
  Filled 2023-03-06: qty 0.5

## 2023-03-06 MED ORDER — COVID-19 MRNA VAC-TRIS(PFIZER) 30 MCG/0.3ML IM SUSY
0.3000 mL | PREFILLED_SYRINGE | Freq: Once | INTRAMUSCULAR | 0 refills | Status: AC
  Filled 2023-03-06: qty 0.3, 1d supply, fill #0

## 2023-03-06 MED ORDER — INFLUENZA VAC A&B SURF ANT ADJ 0.5 ML IM SUSY: 0.5000 mL | PREFILLED_SYRINGE | Freq: Once | INTRAMUSCULAR | Status: DC

## 2023-03-06 NOTE — Progress Notes (Signed)
[facility] OFFICE PROGRESS NOTE   Diagnosis: Colon cancer  INTERVAL HISTORY:   Isaiah Gray returns as scheduled.  He feels well.  Good appetite.  He is working.  He developed acute back pain 1 week ago.  The pain improved after he saw a chiropractor.  Objective:  Vital signs in last 24 hours:  Blood pressure (!) 160/89, pulse 64, temperature 98.2 F (36.8 C), temperature source Temporal, resp. rate 18, height 5\' 10"  (1.778 m), weight 188 lb 9.6 oz (85.5 kg), SpO2 100%.    Lymphatics: No cervical, supraclavicular, axillary, or inguinal nodes, soft mobile smooth rounded mass in the right axilla measuring 4-5 cm Resp: Lungs clear bilaterally Cardio: Regular rate and rhythm GI: No hepatosplenomegaly, nontender, no mass, small reducible bilateral inguinal hernias? Vascular: No leg edema   Lab Results:  Lab Results  Component Value Date   WBC 7.1 07/05/2019   HGB 12.6 (L) 07/05/2019   HCT 37.8 (L) 07/05/2019   MCV 93.3 07/05/2019   PLT 342 07/05/2019   NEUTROABS 4.0 07/05/2019    CMP  Lab Results  Component Value Date   NA 141 03/21/2022   K 3.8 03/21/2022   CL 105 03/21/2022   CO2 28 03/21/2022   GLUCOSE 111 (H) 03/21/2022   BUN 21 03/21/2022   CREATININE 1.16 03/21/2022   CALCIUM 9.6 03/21/2022   PROT 6.3 (L) 07/05/2019   ALBUMIN 3.2 (L) 07/05/2019   AST 31 07/05/2019   ALT 27 07/05/2019   ALKPHOS 129 (H) 07/05/2019   BILITOT 0.6 07/05/2019   GFRNONAA >60 03/21/2022   GFRAA >60 07/05/2019    Lab Results  Component Value Date   CEA1 1.56 10/30/2020   CEA 1.59 03/21/2022    Medications: I have reviewed the patient's current medications.   Assessment/Plan:  Adenocarcinoma of the cecum, stage IIIb (T3N1B)  Right hemicolectomy 03/15/2019 moderate-poorly differentiated, lymphovascular invasion present, 2/17 lymph nodes positive, no loss of mismatch repair protein expression, MSI-low (MSI low pattern also seen in adjacent normal  tissue-possible polymorphism in normal DNA, single locus of instability observed) Colonoscopy 01/25/2019-cecal mass, multiple polyps including the ascending, transverse, and sigmoid colon, pathology from the cecum-adenocarcinoma, pathology from the transverse and sigmoid polyps-tubular adenomas Tubular adenoma at the proximal ascending colon on the right colectomy specimen CTs 02/06/2019-mass at the medial wall of the cecum, 2 small ileocolic nodes measuring 5 mm, 4 mm irregular subpleural nodule in the anterior right middle lobe-favored to be a benign subpleural lymph node Elevated preoperative CEA, normal 04/05/2019 Cycle 1 CAPOX 04/10/2019 Cycle 2 CAPOX 05/01/2019 Cycle 3 CAPOX 05/22/2019, Xeloda dose reduced secondary to hand/foot syndrome and diarrhea, Xeloda discontinued 05/28/2019 secondary to diarrhea and COVID-19 infection Cycle 4 CAPOX 06/21/2019 CTs 06/22/2019-new patchy peripheral consolidations bilaterally, 1 mm calcification left lower pelvis-likely a distal ureteral stone CTs 03/17/2020-no metastatic disease; basilar predominant fibrotic interstitial lung disease favoring postinflammatory fibrosis Colonoscopy 03/27/2020-polyps removed from the sigmoid colon and rectosigmoid colon-hyperplastic polyps CTs 03/22/2021-new mural thickening of the neoterminal ileum, no evidence of metastatic disease elsewhere in the chest, abdomen, pelvis, chronic lung changes Colonoscopy 10/11/2021-no polyps, normal examined ileum CTs 03/21/2022-slight interval increase in soft tissue wall thickening at the neoterminal ileum extending into the ascending colon, no evidence of metastatic disease, fluid-filled outpouching at the apex of the urinary bladder present dating back to February 2021, enlarged prostate with mild symmetric bladder wall thickening, chronic changes in the lungs-likely postinfectious/inflammatory Gastroesophageal reflux Upper endoscopy 10/11/2021-fundic gland polyps Hypertension 4.   Bilateral  inguinal hernias on CT 02/06/19 5.   Family history of colon cancer 6.   Multiple tubular adenomas on the colonoscopy 01/25/2019 and a tubular adenoma on the right colon resection specimen 7.   Elevated liver enzymes-likely secondary to oxaliplatin 8.  Diarrhea, cough, dehydration 05/30/2019-referred to the emergency room and diagnosed with COVID-19 infection, admitted 06/02/2019 completed course of remdesivir and steroids 9.  Oxaliplatin neuropathy 10.  Soft mobile subcutaneous/axillary mass on exam 03/23/2022, 02/26/2023      Disposition: Isaiah Gray is in clinical remission from colon cancer.  He will continue colonoscopy surveillance with Dr. Adela Lank.  He will return for an office visit and CEA in 6 months.  He appears to have a lipoma in the right axilla.   Thornton Papas, MD  03/06/2023  11:14 AM

## 2023-03-15 ENCOUNTER — Other Ambulatory Visit: Payer: Self-pay | Admitting: *Deleted

## 2023-03-15 DIAGNOSIS — R9341 Abnormal radiologic findings on diagnostic imaging of renal pelvis, ureter, or bladder: Secondary | ICD-10-CM

## 2023-03-15 NOTE — Progress Notes (Signed)
The proposed treatment discussed in conference is for discussion purpose only and is not a binding recommendation.  The patients have not been physically examined, or presented with their treatment options.  Therefore, final treatment plans cannot be decided.  

## 2023-03-16 ENCOUNTER — Encounter: Payer: Self-pay | Admitting: *Deleted

## 2023-03-16 NOTE — Progress Notes (Signed)
Called and discussed GI conference discussion with Isaiah Gray and the recommendation for a urology consult to F/U on urological findings. Per the review the findings were felt to be possibly inflamed diverticula and a urachuyhl remnant with minimal change since 2021 on CT.  Urology consult placed.  Advised Isaiah Gray if he had not heard from [facility] within a couple of weeks to call us back. Verbalized understanding

## 2023-04-12 ENCOUNTER — Telehealth: Payer: Self-pay | Admitting: *Deleted

## 2023-04-12 NOTE — Telephone Encounter (Signed)
Isaiah Gray called to inquire on status of his referral to Dr. Modena Slater w/[facility]. This RN followed up with office and they had no record of the referral. Referral order, demographics and medical records faxed to [phone]. Wanted Dr. Truett Perna aware that he has been having intermittent sensation of "twinge" of pain or "pulling" in his left groin area. Has become more persistent recently. No swelling, redness or fever. Should he see PCP or wait for urologist to evaluate?

## 2023-04-13 ENCOUNTER — Telehealth: Payer: Self-pay | Admitting: *Deleted

## 2023-04-13 NOTE — Telephone Encounter (Signed)
Informed Mr. Isaiah Gray that Dr. Truett Perna felt this issue with groin discomfort would best be evaluated with urology or PCP. Low suspicion of cancer. Mr. Isaiah Gray will let us know when he gets an appointment with urology.

## 2023-08-28 ENCOUNTER — Inpatient Hospital Stay: Payer: Medicare Other | Attending: Oncology | Admitting: Oncology

## 2023-08-28 ENCOUNTER — Telehealth: Payer: Self-pay

## 2023-08-28 ENCOUNTER — Inpatient Hospital Stay: Payer: Self-pay

## 2023-08-28 ENCOUNTER — Encounter: Payer: Self-pay | Admitting: Oncology

## 2023-08-28 ENCOUNTER — Other Ambulatory Visit: Payer: Medicare Other

## 2023-08-28 VITALS — BP 157/84 | HR 66 | Temp 98.1°F | Resp 18 | Ht 70.0 in | Wt 189.5 lb

## 2023-08-28 DIAGNOSIS — Z9221 Personal history of antineoplastic chemotherapy: Secondary | ICD-10-CM | POA: Diagnosis not present

## 2023-08-28 DIAGNOSIS — Z85038 Personal history of other malignant neoplasm of large intestine: Secondary | ICD-10-CM | POA: Diagnosis present

## 2023-08-28 DIAGNOSIS — C180 Malignant neoplasm of cecum: Secondary | ICD-10-CM | POA: Diagnosis not present

## 2023-08-28 LAB — CEA (ACCESS): CEA (CHCC): 2.29 ng/mL (ref 0.00–5.00)

## 2023-08-28 NOTE — Telephone Encounter (Signed)
-----   Message from Coni Deep sent at 08/28/2023  1:52 PM EDT ----- Please call patient, the CEA is normal, follow-up as scheduled

## 2023-08-28 NOTE — Progress Notes (Signed)
 [facility] OFFICE PROGRESS NOTE   Diagnosis: Colon cancer  INTERVAL HISTORY:   [name] returns as scheduled.  He feels well.  No difficulty with bowel function.  No bleeding.  Good appetite.  He is working.  He is exercising. He was evaluated by urology for bladder diverticula.  No mass on cystoscopy.  He has occasional discomfort at the left groin.  He underwent injection therapy for right shoulder pain and reports significant improvement.  Objective:  Vital signs in last 24 hours:  Blood pressure (!) 157/84, pulse 66, temperature 98.1 F (36.7 C), resp. rate 18, height 5\' 10"  (1.778 m), weight 189 lb 8 oz (86 kg), SpO2 100%.    Lymphatics: No cervical, supraclavicular, axillary, or inguinal nodes.  Right axillary lipoma Resp: Lungs clear bilaterally Cardio: Regular rate and rhythm GI: No hepatosplenomegaly, nontender, no mass, no apparent hernia Vascular: Leg edema   Lab Results:  Lab Results  Component Value Date   WBC 7.1 07/05/2019   HGB 12.6 (L) 07/05/2019   HCT 37.8 (L) 07/05/2019   MCV 93.3 07/05/2019   PLT 342 07/05/2019   NEUTROABS 4.0 07/05/2019    CMP  Lab Results  Component Value Date   NA 141 03/21/2022   K 3.8 03/21/2022   CL 105 03/21/2022   CO2 28 03/21/2022   GLUCOSE 111 (H) 03/21/2022   BUN 21 03/21/2022   CREATININE 1.16 03/21/2022   CALCIUM  9.6 03/21/2022   PROT 6.3 (L) 07/05/2019   ALBUMIN 3.2 (L) 07/05/2019   AST 31 07/05/2019   ALT 27 07/05/2019   ALKPHOS 129 (H) 07/05/2019   BILITOT 0.6 07/05/2019   GFRNONAA >60 03/21/2022   GFRAA >60 07/05/2019    Lab Results  Component Value Date   CEA1 1.56 10/30/2020   CEA 1.62 03/06/2023    Lab Results  Component Value Date   INR 1.0 03/12/2019   LABPROT 13.4 03/12/2019    Imaging:  No results found.  Medications: I have reviewed the patient's current medications.   Assessment/Plan: Adenocarcinoma of the cecum, stage IIIb (T3N1B)  Right hemicolectomy  03/15/2019 moderate-poorly differentiated, lymphovascular invasion present, 2/17 lymph nodes positive, no loss of mismatch repair protein expression, MSI-low (MSI low pattern also seen in adjacent normal tissue-possible polymorphism in normal DNA, single locus of instability observed) Colonoscopy 01/25/2019-cecal mass, multiple polyps including the ascending, transverse, and sigmoid colon, pathology from the cecum-adenocarcinoma, pathology from the transverse and sigmoid polyps-tubular adenomas Tubular adenoma at the proximal ascending colon on the right colectomy specimen CTs 02/06/2019-mass at the medial wall of the cecum, 2 small ileocolic nodes measuring 5 mm, 4 mm irregular subpleural nodule in the anterior right middle lobe-favored to be a benign subpleural lymph node Elevated preoperative CEA, normal 04/05/2019 Cycle 1 CAPOX 04/10/2019 Cycle 2 CAPOX 05/01/2019 Cycle 3 CAPOX 05/22/2019, Xeloda  dose reduced secondary to hand/foot syndrome and diarrhea, Xeloda  discontinued 05/28/2019 secondary to diarrhea and COVID-19 infection Cycle 4 CAPOX 06/21/2019 CTs 06/22/2019-new patchy peripheral consolidations bilaterally, 1 mm calcification left lower pelvis-likely a distal ureteral stone CTs 03/17/2020-no metastatic disease; basilar predominant fibrotic interstitial lung disease favoring postinflammatory fibrosis Colonoscopy 03/27/2020-polyps removed from the sigmoid colon and rectosigmoid colon-hyperplastic polyps CTs 03/22/2021-new mural thickening of the neoterminal ileum, no evidence of metastatic disease elsewhere in the chest, abdomen, pelvis, chronic lung changes Colonoscopy 10/11/2021-no polyps, normal examined ileum CTs 03/21/2022-slight interval increase in soft tissue wall thickening at the neoterminal ileum extending into the ascending colon, no evidence of metastatic disease, fluid-filled outpouching  at the apex of the urinary bladder present dating back to February 2021, enlarged prostate with mild  symmetric bladder wall thickening, chronic changes in the lungs-likely postinfectious/inflammatory Cystoscopy 08/01/2023: Diverticula, no mass Gastroesophageal reflux Upper endoscopy 10/11/2021-fundic gland polyps Hypertension 4.   Bilateral inguinal hernias on CT 02/06/19 5.   Family history of colon cancer 6.   Multiple tubular adenomas on the colonoscopy 01/25/2019 and a tubular adenoma on the right colon resection specimen 7.   Elevated liver enzymes-likely secondary to oxaliplatin  8.  Diarrhea, cough, dehydration 05/30/2019-referred to the emergency room and diagnosed with COVID-19 infection, admitted 06/02/2019 completed course of remdesivir  and steroids 9.  Oxaliplatin  neuropathy 10.  Soft mobile subcutaneous/axillary mass on exam 03/23/2022, 02/26/2023, 08/28/2023       Disposition: Isaiah Gray is in clinical remission from colon cancer.  We will follow-up on the CEA from today.  He will return for an office visit and CEA in 6 months.  He continues colonoscopy surveillance with Dr. General Kenner.  Coni Deep, MD  08/28/2023  8:37 AM

## 2023-08-28 NOTE — Telephone Encounter (Signed)
 Patient gave verbal understanding and had no further questions or concerns

## 2024-02-26 ENCOUNTER — Other Ambulatory Visit: Payer: Self-pay | Admitting: Plastic Surgery

## 2024-02-28 LAB — DERMATOLOGY PATHOLOGY

## 2024-03-12 ENCOUNTER — Inpatient Hospital Stay

## 2024-03-12 ENCOUNTER — Inpatient Hospital Stay: Attending: Oncology

## 2024-03-12 ENCOUNTER — Inpatient Hospital Stay: Admitting: Oncology

## 2024-03-12 ENCOUNTER — Ambulatory Visit: Payer: Self-pay | Admitting: Oncology

## 2024-03-12 VITALS — BP 134/87 | HR 70 | Temp 98.2°F | Resp 18 | Ht 70.0 in | Wt 191.2 lb

## 2024-03-12 DIAGNOSIS — Z23 Encounter for immunization: Secondary | ICD-10-CM

## 2024-03-12 DIAGNOSIS — C180 Malignant neoplasm of cecum: Secondary | ICD-10-CM

## 2024-03-12 DIAGNOSIS — Z125 Encounter for screening for malignant neoplasm of prostate: Secondary | ICD-10-CM | POA: Diagnosis not present

## 2024-03-12 LAB — CEA (ACCESS): CEA (CHCC): 2.35 ng/mL (ref 0.00–5.00)

## 2024-03-12 MED ORDER — INFLUENZA VAC SPLIT HIGH-DOSE 0.5 ML IM SUSY
0.5000 mL | PREFILLED_SYRINGE | Freq: Once | INTRAMUSCULAR | Status: AC
  Administered 2024-03-12: 0.5 mL via INTRAMUSCULAR
  Filled 2024-03-12: qty 0.5

## 2024-03-12 NOTE — Telephone Encounter (Signed)
-----   Message from Arley Hof sent at 03/12/2024  2:42 PM EST ----- Please call patient, the CEA is normal, follow-up as scheduled  ----- Message ----- From: Rebecka, Lab In [place] Sent: 03/12/2024   9:04 AM EST To: Arley KATHEE Hof, MD

## 2024-03-12 NOTE — Telephone Encounter (Signed)
 Patient gave verbal understanding and had no further questions.

## 2024-03-12 NOTE — Progress Notes (Signed)
 Isaiah Gray   Diagnosis: Colon cancer  INTERVAL HISTORY:   Isaiah Gray returns as scheduled.  He feels well.  Good appetite.  No difficulty with bowel function.  No bleeding.  He injured his right knee recently and had an MRI last night.  He had multiple sebaceous skin lesions removed last month.  Objective:  Vital signs in last 24 hours:  Blood pressure 134/87, pulse 70, temperature 98.2 F (36.8 C), temperature source Temporal, resp. rate 18, height 5' 10 (1.778 m), weight 191 lb 3.2 oz (86.7 kg), SpO2 100%.    Lymphatics: No cervical, supraclavicular, left axillary, or inguinal nodes.  Soft mobile 4-5 cm right axillary mass Resp: Lungs clear bilaterally Cardio: Regular rate and rhythm GI: No hepatosplenomegaly Vascular: No leg edema  Lab Results:  Lab Results  Component Value Date   WBC 7.1 07/05/2019   HGB 12.6 (L) 07/05/2019   HCT 37.8 (L) 07/05/2019   MCV 93.3 07/05/2019   PLT 342 07/05/2019   NEUTROABS 4.0 07/05/2019    CMP  Lab Results  Component Value Date   NA 141 03/21/2022   K 3.8 03/21/2022   CL 105 03/21/2022   CO2 28 03/21/2022   GLUCOSE 111 (H) 03/21/2022   BUN 21 03/21/2022   CREATININE 1.16 03/21/2022   CALCIUM  9.6 03/21/2022   PROT 6.3 (L) 07/05/2019   ALBUMIN 3.2 (L) 07/05/2019   AST 31 07/05/2019   ALT 27 07/05/2019   ALKPHOS 129 (H) 07/05/2019   BILITOT 0.6 07/05/2019   GFRNONAA >60 03/21/2022   GFRAA >60 07/05/2019    Lab Results  Component Value Date   CEA1 1.56 10/30/2020   CEA 2.29 08/28/2023     Medications: I have reviewed the patient's current medications.   Assessment/Plan:  Adenocarcinoma of the cecum, stage IIIb (T3N1B)  Right hemicolectomy 03/15/2019 moderate-poorly differentiated, lymphovascular invasion present, 2/17 lymph nodes positive, no loss of mismatch repair protein expression, MSI-low (MSI low pattern also seen in adjacent normal tissue-possible polymorphism in normal  DNA, single locus of instability observed) Colonoscopy 01/25/2019-cecal mass, multiple polyps including the ascending, transverse, and sigmoid colon, pathology from the cecum-adenocarcinoma, pathology from the transverse and sigmoid polyps-tubular adenomas Tubular adenoma at the proximal ascending colon on the right colectomy specimen CTs 02/06/2019-mass at the medial wall of the cecum, 2 small ileocolic nodes measuring 5 mm, 4 mm irregular subpleural nodule in the anterior right middle lobe-favored to be a benign subpleural lymph node Elevated preoperative CEA, normal 04/05/2019 Cycle 1 CAPOX 04/10/2019 Cycle 2 CAPOX 05/01/2019 Cycle 3 CAPOX 05/22/2019, Xeloda  dose reduced secondary to hand/foot syndrome and diarrhea, Xeloda  discontinued 05/28/2019 secondary to diarrhea and COVID-19 infection Cycle 4 CAPOX 06/21/2019 CTs 06/22/2019-new patchy peripheral consolidations bilaterally, 1 mm calcification left lower pelvis-likely a distal ureteral stone CTs 03/17/2020-no metastatic disease; basilar predominant fibrotic interstitial lung disease favoring postinflammatory fibrosis Colonoscopy 03/27/2020-polyps removed from the sigmoid colon and rectosigmoid colon-hyperplastic polyps CTs 03/22/2021-new mural thickening of the neoterminal ileum, no evidence of metastatic disease elsewhere in the chest, abdomen, pelvis, chronic lung changes Colonoscopy 10/11/2021-no polyps, normal examined ileum CTs 03/21/2022-slight interval increase in soft tissue wall thickening at the neoterminal ileum extending into the ascending colon, no evidence of metastatic disease, fluid-filled outpouching at the apex of the urinary bladder present dating back to February 2021, enlarged prostate with mild symmetric bladder wall thickening, chronic changes in the lungs-likely postinfectious/inflammatory Cystoscopy 08/01/2023: Diverticula, no mass Gastroesophageal reflux Upper endoscopy 10/11/2021-fundic gland polyps Hypertension 4.  Bilateral  inguinal hernias on CT 02/06/19 5.   Family history of colon cancer 6.   Multiple tubular adenomas on the colonoscopy 01/25/2019 and a tubular adenoma on the right colon resection specimen 7.   Elevated liver enzymes-likely secondary to oxaliplatin  8.  Diarrhea, cough, dehydration 05/30/2019-referred to the emergency room and diagnosed with COVID-19 infection, admitted 06/02/2019 completed course of remdesivir  and steroids 9.  Oxaliplatin  neuropathy 10.  Soft mobile subcutaneous/axillary mass on exam 03/23/2022, 02/26/2023, 08/28/2023, 03/12/2024      Disposition: Isaiah Gray is in clinical remission from colon cancer.  He is now 5 years out from diagnosis.  He has a good prognosis for a long-term disease-free survival.  He will continue colonoscopy surveillance with Dr. Leigh.  We will follow-up on the CEA from today.  The saw right axillary mass is likely a lipoma.  He would like to continue follow-up at the Cancer center.  He will return for an office visit in 1 year.  Isaiah Gray requested we check his PSA today.  The PSA was normal, but higher when checked by his primary provider in July.  Arley Hof, MD  03/12/2024  8:15 AM

## 2024-03-13 ENCOUNTER — Ambulatory Visit: Payer: Self-pay | Admitting: Oncology

## 2024-03-13 LAB — PROSTATE-SPECIFIC AG, SERUM (LABCORP): Prostate Specific Ag, Serum: 2.3 ng/mL (ref 0.0–4.0)

## 2024-03-13 NOTE — Telephone Encounter (Signed)
 Patient gave verbal understanding and had no further questions.

## 2024-03-13 NOTE — Telephone Encounter (Signed)
-----   Message from Arley Hof sent at 03/13/2024  1:45 PM EST ----- Please call patient, the PSA is normal, follow-up as scheduled, copy PSA to his primary provider  ----- Message ----- From: Rebecka, Lab In [place] Sent: 03/13/2024   9:36 AM EST To: Arley KATHEE Hof, MD

## 2025-03-11 ENCOUNTER — Inpatient Hospital Stay

## 2025-03-11 ENCOUNTER — Inpatient Hospital Stay: Admitting: Oncology
